# Patient Record
Sex: Female | Born: 1960 | Race: White | Hispanic: No | Marital: Married | Smoking: Never smoker
Health system: Southern US, Community
[De-identification: ages and names within clinical notes are randomized; demographics above are authoritative.]

## PROBLEM LIST (undated history)

## (undated) DIAGNOSIS — R011 Cardiac murmur, unspecified: Secondary | ICD-10-CM

## (undated) DIAGNOSIS — E663 Overweight: Secondary | ICD-10-CM

## (undated) DIAGNOSIS — R001 Bradycardia, unspecified: Secondary | ICD-10-CM

## (undated) DIAGNOSIS — Z6825 Body mass index (BMI) 25.0-25.9, adult: Secondary | ICD-10-CM

## (undated) HISTORY — PX: NO PAST SURGERIES: SHX2092

## (undated) HISTORY — DX: Cardiac murmur, unspecified: R01.1

## (undated) HISTORY — DX: Overweight: E66.3

## (undated) HISTORY — DX: Bradycardia, unspecified: R00.1

## (undated) HISTORY — DX: Body mass index (BMI) 25.0-25.9, adult: Z68.25

---

## 1998-05-26 ENCOUNTER — Inpatient Hospital Stay (HOSPITAL_COMMUNITY): Admission: AD | Admit: 1998-05-26 | Discharge: 1998-05-29 | Payer: Self-pay | Admitting: Obstetrics and Gynecology

## 2010-08-08 ENCOUNTER — Emergency Department (HOSPITAL_COMMUNITY)
Admission: EM | Admit: 2010-08-08 | Discharge: 2010-08-08 | Payer: Self-pay | Source: Home / Self Care | Admitting: Emergency Medicine

## 2015-08-24 ENCOUNTER — Encounter (HOSPITAL_BASED_OUTPATIENT_CLINIC_OR_DEPARTMENT_OTHER): Payer: Self-pay | Admitting: *Deleted

## 2015-08-24 ENCOUNTER — Emergency Department (HOSPITAL_BASED_OUTPATIENT_CLINIC_OR_DEPARTMENT_OTHER)
Admission: EM | Admit: 2015-08-24 | Discharge: 2015-08-24 | Disposition: A | Discharge status: Home / Self Care | Payer: 59 | Attending: Emergency Medicine | Admitting: Emergency Medicine

## 2015-08-24 DIAGNOSIS — S0181XA Laceration without foreign body of other part of head, initial encounter: Secondary | ICD-10-CM

## 2015-08-24 DIAGNOSIS — W228XXA Striking against or struck by other objects, initial encounter: Secondary | ICD-10-CM | POA: Diagnosis not present

## 2015-08-24 DIAGNOSIS — S01112A Laceration without foreign body of left eyelid and periocular area, initial encounter: Secondary | ICD-10-CM | POA: Insufficient documentation

## 2015-08-24 DIAGNOSIS — S0993XA Unspecified injury of face, initial encounter: Secondary | ICD-10-CM | POA: Diagnosis present

## 2015-08-24 DIAGNOSIS — Y9289 Other specified places as the place of occurrence of the external cause: Secondary | ICD-10-CM | POA: Diagnosis not present

## 2015-08-24 DIAGNOSIS — Y9302 Activity, running: Secondary | ICD-10-CM | POA: Diagnosis not present

## 2015-08-24 DIAGNOSIS — Z23 Encounter for immunization: Secondary | ICD-10-CM | POA: Insufficient documentation

## 2015-08-24 DIAGNOSIS — Y998 Other external cause status: Secondary | ICD-10-CM | POA: Diagnosis not present

## 2015-08-24 MED ORDER — ACETAMINOPHEN 325 MG PO TABS
975.0000 mg | ORAL_TABLET | Freq: Once | ORAL | Status: AC
  Administered 2015-08-24: 975 mg via ORAL
  Filled 2015-08-24: qty 3

## 2015-08-24 MED ORDER — TETANUS-DIPHTH-ACELL PERTUSSIS 5-2.5-18.5 LF-MCG/0.5 IM SUSP
0.5000 mL | Freq: Once | INTRAMUSCULAR | Status: AC
  Administered 2015-08-24: 0.5 mL via INTRAMUSCULAR
  Filled 2015-08-24: qty 0.5

## 2015-08-24 NOTE — Discharge Instructions (Signed)
Follow with your doctor in a week's time. Return for redness swelling drainage. He can apply bacitracin or Vaseline after 3-5 days to dissolve the adhesive.

## 2015-08-24 NOTE — ED Notes (Signed)
Lac to forehead- hit in head with car door- denies LOC

## 2015-08-24 NOTE — ED Notes (Signed)
Pt is requesting to see Dr. Adela LankFloyd prior to leaving.

## 2015-08-24 NOTE — ED Provider Notes (Signed)
CSN: [id number]     Arrival date & time 08/24/15  2031 History   First MD Initiated Contact with Patient 08/24/15 2051     Chief Complaint  Patient presents with  . Facial Laceration     (Consider location/radiation/quality/duration/timing/severity/associated sxs/prior Treatment) Patient is a 55 y.o. female presenting with skin laceration. The history is provided by the patient.  Laceration Location:  Face Facial laceration location:  L eyebrow Length (cm):  2 Depth:  Cutaneous Quality: straight   Bleeding: venous   Time since incident: 2. Laceration mechanism:  Blunt object (car door) Pain details:    Quality:  Burning   Severity:  Mild   Timing:  Constant   Progression:  Unchanged Foreign body present:  No foreign bodies Relieved by:  Nothing Worsened by:  Nothing tried Ineffective treatments:  None tried Tetanus status:  Out of date   History reviewed. No pertinent past medical history. History reviewed. No pertinent past surgical history. No family history on file. Social History  Substance Use Topics  . Smoking status: Never Smoker   . Smokeless tobacco: Never Used  . Alcohol Use: Yes     Comment: 3-4 drinks/ week   OB History    No data available     Review of Systems  Constitutional: Negative for fever and chills.  HENT: Negative for congestion and rhinorrhea.   Eyes: Negative for redness and visual disturbance.  Respiratory: Negative for shortness of breath and wheezing.   Cardiovascular: Negative for chest pain and palpitations.  Gastrointestinal: Negative for nausea and vomiting.  Genitourinary: Negative for dysuria and urgency.  Musculoskeletal: Negative for myalgias and arthralgias.  Skin: Positive for wound. Negative for pallor.  Neurological: Negative for dizziness and headaches.      Allergies  Review of patient's allergies indicates no known allergies.  Home Medications   Prior to Admission medications   Not on File   BP 120/74 mmHg   Pulse 60  Temp(Src) 98.2 F (36.8 C) (Oral)  Resp 16  Ht 5\' 6"  (1.676 m)  Wt 145 lb (65.772 kg)  BMI 23.41 kg/m2  SpO2 100%  LMP 08/15/2015 Physical Exam  Constitutional: She is oriented to person, place, and time. She appears well-developed and well-nourished. No distress.  HENT:  Head: Normocephalic and atraumatic.    Eyes: EOM are normal. Pupils are equal, round, and reactive to light.  Neck: Normal range of motion. Neck supple.  Cardiovascular: Normal rate and regular rhythm.  Exam reveals no gallop and no friction rub.   No murmur heard. Pulmonary/Chest: Effort normal. She has no wheezes. She has no rales.  Abdominal: Soft. She exhibits no distension. There is no tenderness. There is no rebound and no guarding.  Musculoskeletal: She exhibits no edema or tenderness.  Neurological: She is alert and oriented to person, place, and time.  Skin: Skin is warm and dry. She is not diaphoretic.  Psychiatric: She has a normal mood and affect. Her behavior is normal.  Nursing note and vitals reviewed.   ED Course  .Marland Kitchen.Laceration Repair Date/Time: 08/24/2015 11:26 PM Performed by: Adela LankFLOYD, Rebbeca Sheperd Authorized by: Melene PlanFLOYD, Dominga Mcduffie Consent: Verbal consent obtained. Risks and benefits: risks, benefits and alternatives were discussed Consent given by: patient Required items: required blood products, implants, devices, and special equipment available Time out: Immediately prior to procedure a "time out" was called to verify the correct patient, procedure, equipment, support staff and site/side marked as required. Body area: head/neck Location details: left eyebrow Laceration length: 2 cm Tendon  involvement: none Nerve involvement: none Vascular damage: no Anesthesia: local infiltration Local anesthetic: lidocaine 1% with epinephrine Anesthetic total: 2 ml Patient sedated: no Preparation: Patient was prepped and draped in the usual sterile fashion. Irrigation solution: saline Irrigation method:  jet lavage Amount of cleaning: standard Debridement: none Degree of undermining: none Skin closure: glue Approximation: close Approximation difficulty: simple Patient tolerance: Patient tolerated the procedure well with no immediate complications   (including critical care time) Labs Review Labs Reviewed - No data to display  Imaging Review No results found. I have personally reviewed and evaluated these images and lab results as part of my medical decision-making.   EKG Interpretation None      MDM   Final diagnoses:  Facial laceration, initial encounter    55 yo F with a chief complaint of a facial laceration. Patient was trying to go to the car and ran into the car door. Superficial laceration just through the skin. Repaired with glue. PCP follow-up.  11:30 PM:  I have discussed the diagnosis/risks/treatment options with the patient and family and believe the pt to be eligible for discharge home to follow-up with PCP. We also discussed returning to the ED immediately if new or worsening sx occur. We discussed the sx which are most concerning (e.g., sudden worsening redness, fever, drainage) that necessitate immediate return. Medications administered to the patient during their visit and any new prescriptions provided to the patient are listed below.  Medications given during this visit Medications  Tdap (BOOSTRIX) injection 0.5 mL (0.5 mLs Intramuscular Given 08/24/15 2142)  acetaminophen (TYLENOL) tablet 975 mg (975 mg Oral Given 08/24/15 2138)    There are no discharge medications for this patient.   The patient appears reasonably screen and/or stabilized for discharge and I doubt any other medical condition or other [facility] requiring further screening, evaluation, or treatment in the ED at this time prior to discharge.      Melene Plan, DO 08/24/15 2331

## 2018-05-01 ENCOUNTER — Emergency Department (HOSPITAL_COMMUNITY): Payer: 59

## 2018-05-01 ENCOUNTER — Emergency Department (HOSPITAL_COMMUNITY)
Admission: EM | Admit: 2018-05-01 | Discharge: 2018-05-01 | Disposition: A | Discharge status: Home / Self Care | Payer: 59 | Attending: Emergency Medicine | Admitting: Emergency Medicine

## 2018-05-01 ENCOUNTER — Other Ambulatory Visit: Payer: Self-pay

## 2018-05-01 ENCOUNTER — Encounter (HOSPITAL_COMMUNITY): Payer: Self-pay | Admitting: Emergency Medicine

## 2018-05-01 DIAGNOSIS — Y939 Activity, unspecified: Secondary | ICD-10-CM | POA: Insufficient documentation

## 2018-05-01 DIAGNOSIS — X501XXA Overexertion from prolonged static or awkward postures, initial encounter: Secondary | ICD-10-CM | POA: Diagnosis not present

## 2018-05-01 DIAGNOSIS — Y999 Unspecified external cause status: Secondary | ICD-10-CM | POA: Insufficient documentation

## 2018-05-01 DIAGNOSIS — Y929 Unspecified place or not applicable: Secondary | ICD-10-CM | POA: Insufficient documentation

## 2018-05-01 DIAGNOSIS — S93401A Sprain of unspecified ligament of right ankle, initial encounter: Secondary | ICD-10-CM

## 2018-05-01 IMAGING — DX DG ANKLE COMPLETE 3+V*R*
3 series · 3 of 3 positions shown · non-contrast
Comparison: None.

CLINICAL DATA: Fell on steps and twisted the right ankle. Pain and
swelling.

EXAM:
RIGHT ANKLE - COMPLETE 3+ VIEW

[ankle ap]
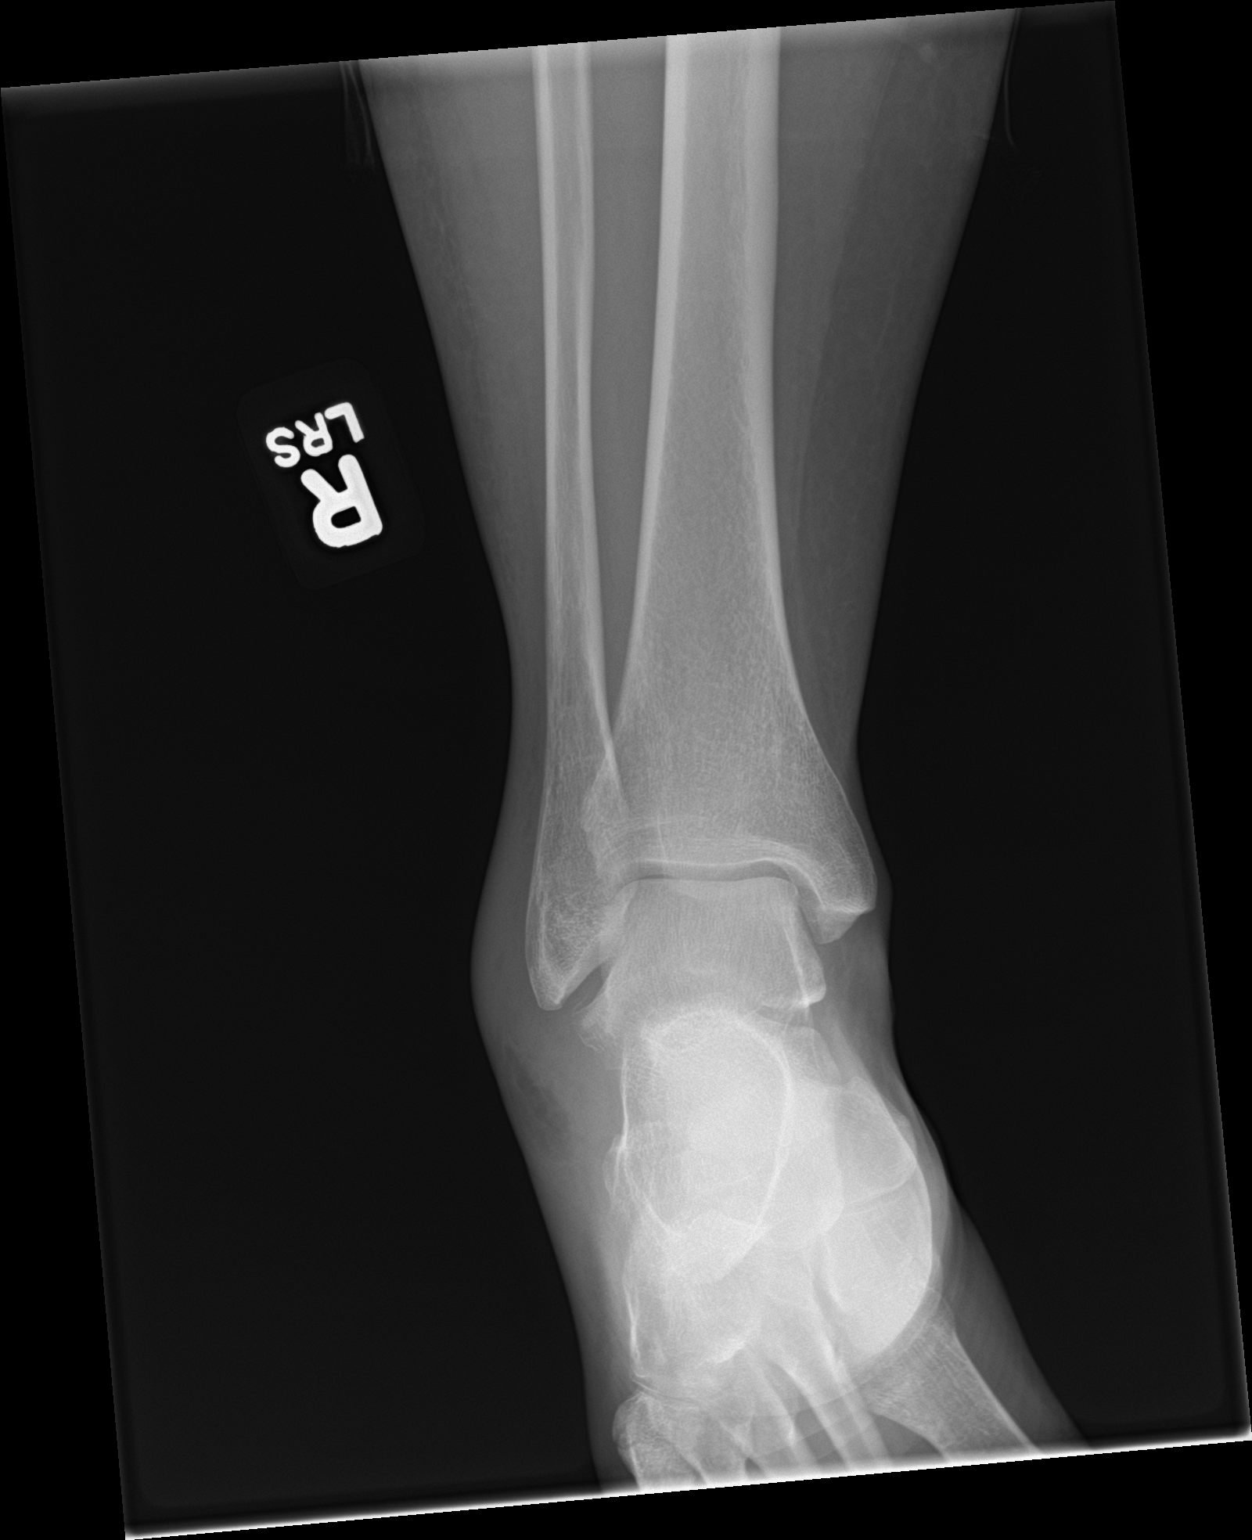

[ankle obl]
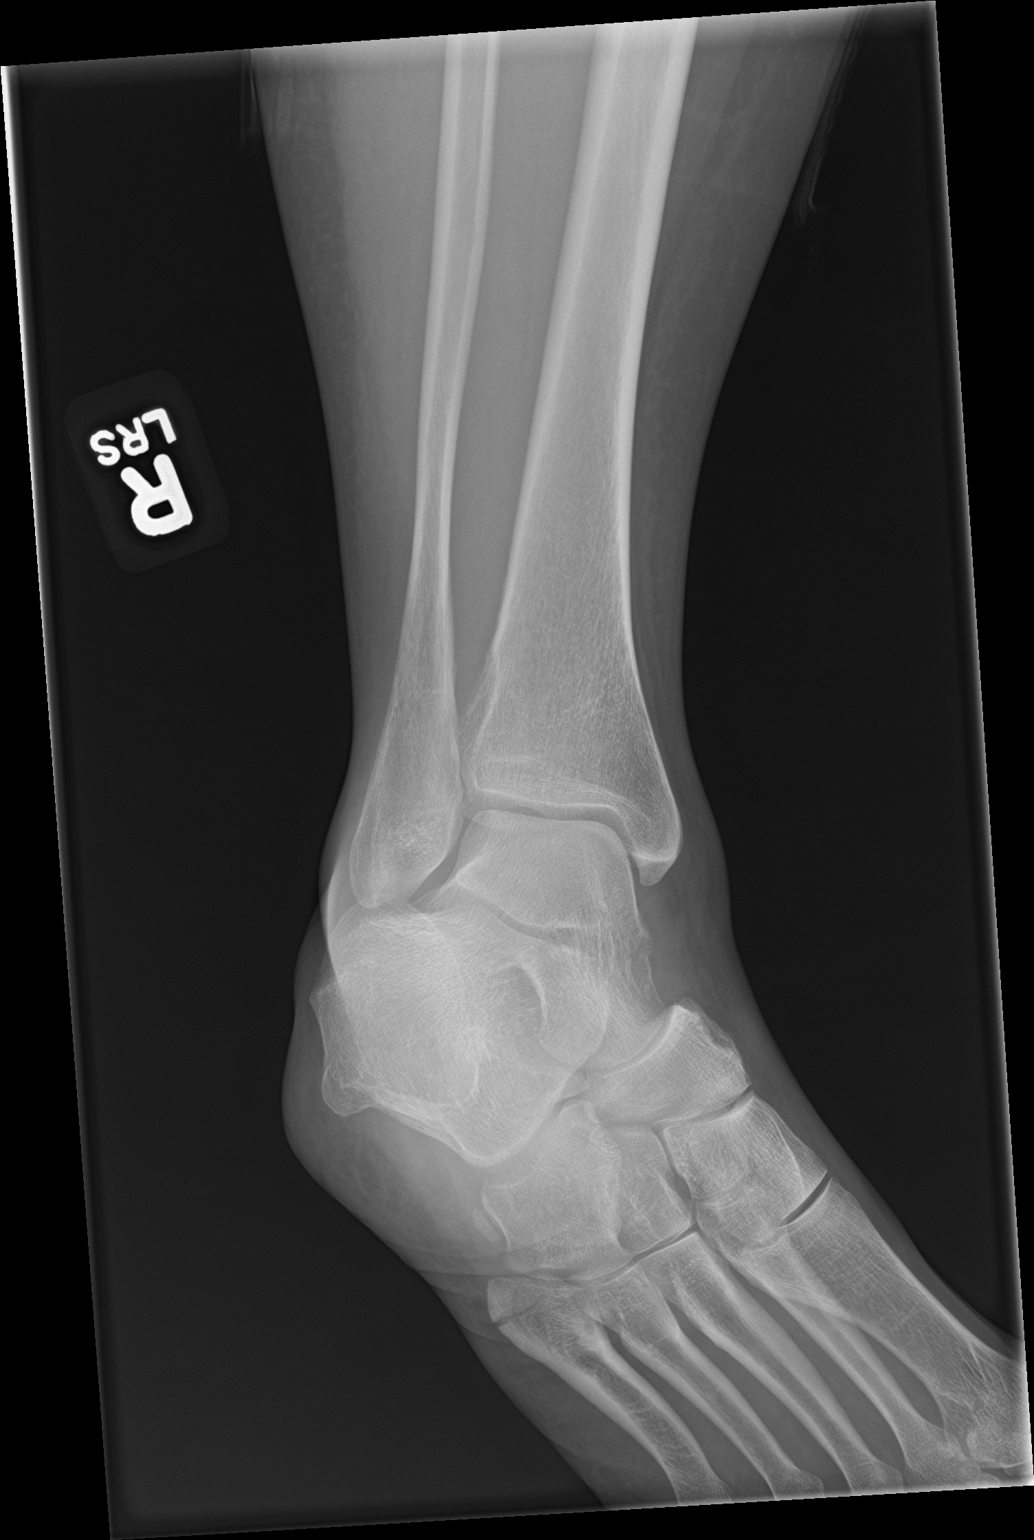

[ankle lat]
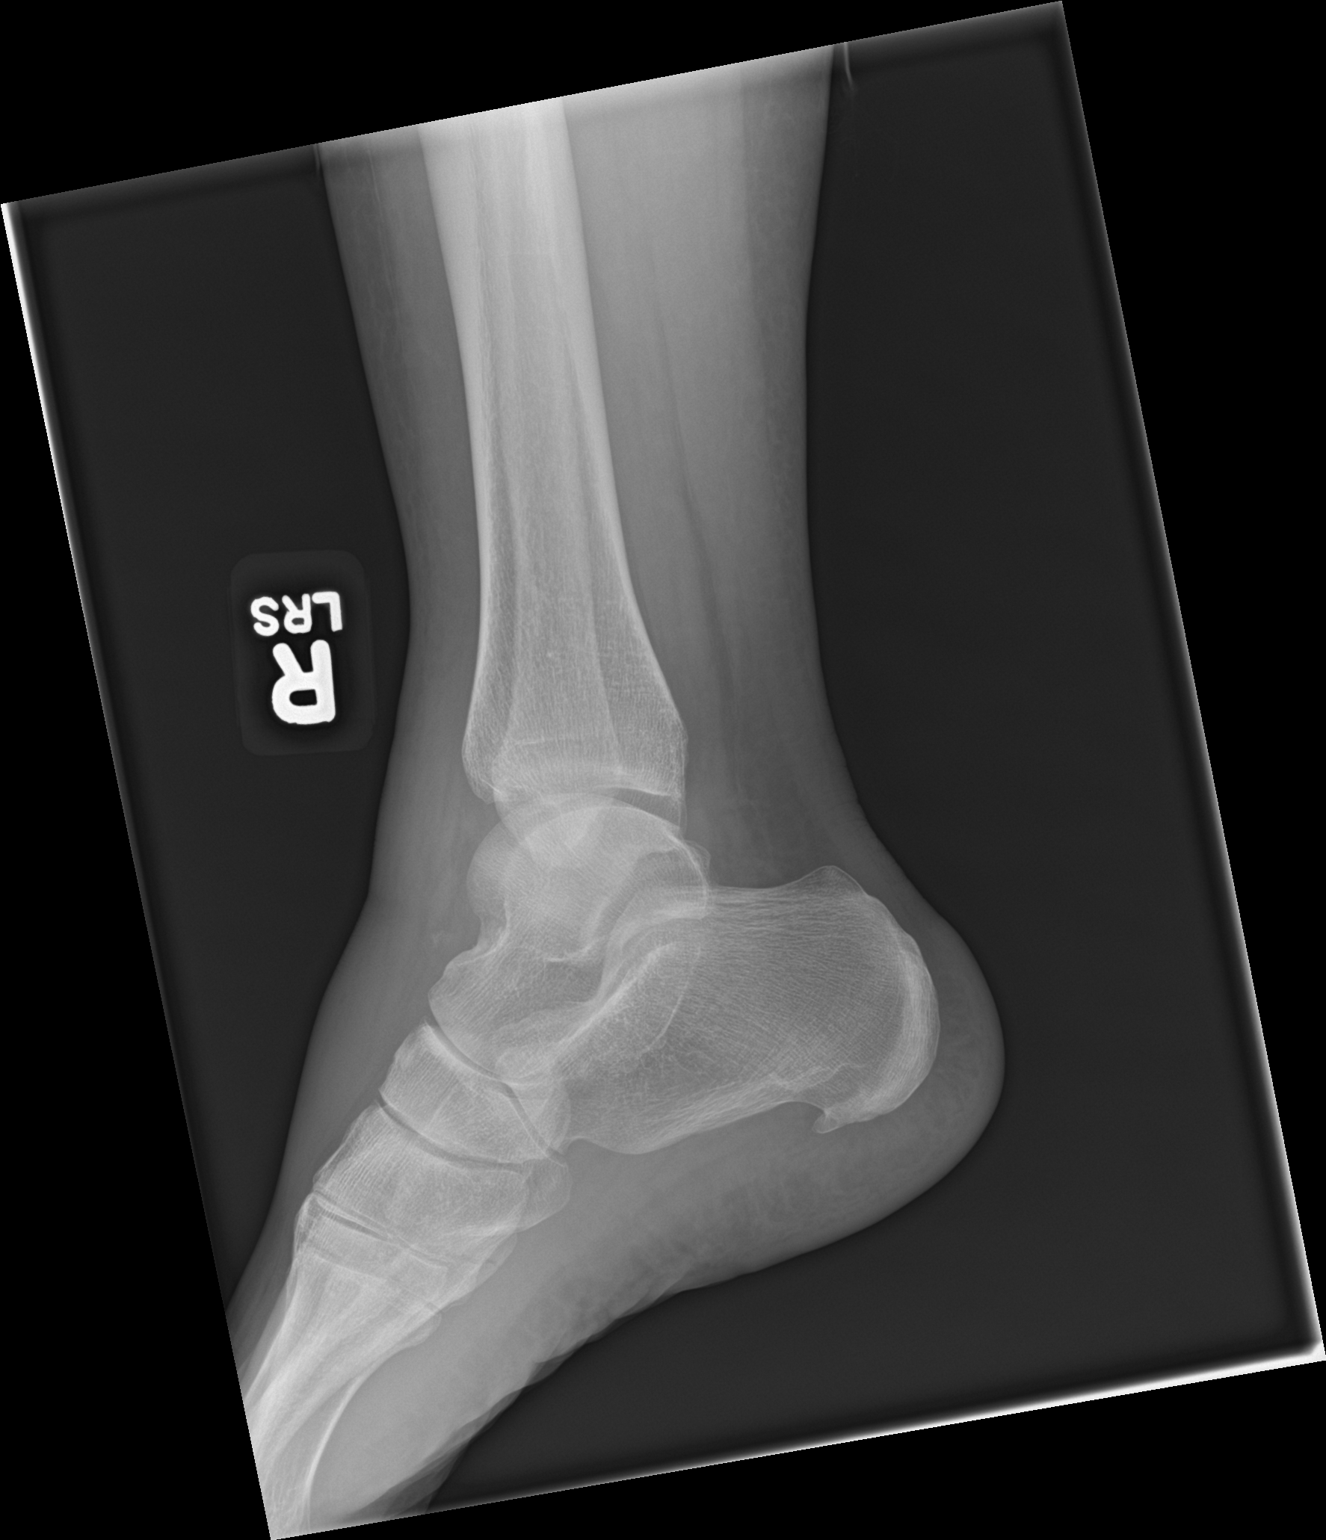

[3 of 3 positions shown; findings below may reference images not displayed]

FINDINGS: Diffuse soft tissue swelling over the anterior and lateral aspect of
the right ankle. Acute appearing ununited ossicles demonstrated
anterior to the mid talus and in the lateral talofibular joint
suggesting ligamentous avulsion injuries. No displaced fractures are
identified. Talar dome appears intact. No dislocation. Small plantar
calcaneal spur.
IMPRESSION: Soft tissue swelling over the anterior and lateral aspect of the
right ankle. Ununited ossicles demonstrated anterior to the talus
and in the lateral talofibular joint suggesting ligamentous avulsion
injuries.

## 2018-05-01 MED ORDER — IBUPROFEN 600 MG PO TABS: 600.0000 mg | ORAL_TABLET | Freq: Four times a day (QID) | ORAL | 0 refills | Status: DC | PRN

## 2018-05-01 MED ORDER — IBUPROFEN 400 MG PO TABS
600.0000 mg | ORAL_TABLET | Freq: Once | ORAL | Status: AC
  Administered 2018-05-01: 600 mg via ORAL
  Filled 2018-05-01: qty 1

## 2018-05-01 MED ORDER — OXYCODONE-ACETAMINOPHEN 5-325 MG PO TABS
1.0000 | ORAL_TABLET | ORAL | Status: AC | PRN
  Administered 2018-05-01 (×2): 1 via ORAL
  Filled 2018-05-01 (×2): qty 1

## 2018-05-01 NOTE — ED Notes (Addendum)
Pt reports missing the last step as she was walking down steps. Pt denies any pain at this time. Pt reports the pain medicine she was given in triage helped her.

## 2018-05-01 NOTE — ED Triage Notes (Signed)
Pt was going outside to shake a rug and missed the last step.  C/o pain and swelling to R ankle.

## 2018-05-01 NOTE — ED Provider Notes (Signed)
[facility]   CSN: [id number] Arrival date & time: 05/01/18  0018     History   Chief Complaint Chief Complaint  Patient presents with  . Ankle Pain    HPI Janet Mclaughlin is a 57 y.o. female.  Patient here with pain and swelling to her right ankle after missing a step causing an inversion motion of the ankle. She states she came down with her full weight and has been unable to put any pressure on the ankle since. Injury occurred around 11:00 pm 04/30/18. No wound.      History reviewed. No pertinent past medical history.  There are no active problems to display for this patient.   History reviewed. No pertinent surgical history.   OB History   None      Home Medications    Prior to Admission medications   Not on File    Family History No family history on file.  Social History Social History   Tobacco Use  . Smoking status: Never Smoker  . Smokeless tobacco: Never Used  Substance Use Topics  . Alcohol use: Yes    Comment: 3-4 drinks/ week  . Drug use: No     Allergies   Patient has no known allergies.   Review of Systems Review of Systems  Constitutional: Negative for diaphoresis.  Musculoskeletal:       See HPI.  Skin: Negative.   Neurological: Negative.      Physical Exam Updated Vital Signs BP 128/84   Pulse 61   Temp 98.5 F (36.9 C) (Oral)   Resp 18   SpO2 100%   Physical Exam  Constitutional: She is oriented to person, place, and time. She appears well-developed and well-nourished.  Neck: Normal range of motion.  Cardiovascular: Intact distal pulses.  Pulmonary/Chest: Effort normal.  Musculoskeletal:  Right ankle swollen with light ecchymosis to anterolateral ankle. No bony deformity. No joint laxity. Achilles intact. No calf tenderness or swelling.  Neurological: She is alert and oriented to person, place, and time. No sensory deficit.  Skin: Skin is warm and dry.      ED Treatments / Results  Labs (all labs ordered are listed, but only abnormal results are displayed) Labs Reviewed - No data to display  EKG None  Radiology Dg Ankle Complete Right  Result Date: 05/01/2018 CLINICAL DATA:  Larey SeatFell on steps and twisted the right ankle. Pain and swelling. EXAM: RIGHT ANKLE - COMPLETE 3+ VIEW COMPARISON:  None. FINDINGS: Diffuse soft tissue swelling over the anterior and lateral aspect of the right ankle. Acute appearing ununited ossicles demonstrated anterior to the mid talus and in the lateral talofibular joint suggesting ligamentous avulsion injuries. No displaced fractures are identified. Talar dome appears intact. No dislocation. Small plantar calcaneal spur. IMPRESSION: Soft tissue swelling over the anterior and lateral aspect of the right ankle. Ununited ossicles demonstrated anterior to the talus and in the lateral talofibular joint suggesting ligamentous avulsion injuries. Electronically Signed   By: Burman NievesWilliam  Stevens M.D.   On: 05/01/2018 01:02    Procedures Procedures (including critical care time)  Medications Ordered in ED Medications  oxyCODONE-acetaminophen (PERCOCET/ROXICET) 5-325 MG per tablet 1 tablet (1 tablet Oral Given 05/01/18 0035)     Initial Impression / Assessment and Plan / ED Course  I have reviewed the triage vital signs and the nursing notes.  Pertinent labs & imaging results that were available during my care of the patient were reviewed by me  and considered in my medical decision making (see chart for details).     Patient here with right ankle inversion injury. No fracture. ASO and crutches provided. Refer to orthopedics for any persistent pain. Weight bearing as tolerated.  Final Clinical Impressions(s) / ED Diagnoses   Final diagnoses:  None   1. Right ankle sprain  ED Discharge Orders    None       Danne Harbor 05/02/18 2242    Glynn Octave, MD 05/03/18 [phone]

## 2018-05-01 NOTE — ED Notes (Signed)
Patient verbalizes understanding of discharge instructions. Opportunity for questioning and answers were provided. Armband removed by staff, pt discharged from ED home via POV.  

## 2019-06-11 ENCOUNTER — Other Ambulatory Visit: Payer: Self-pay

## 2019-06-11 DIAGNOSIS — Z20822 Contact with and (suspected) exposure to covid-19: Secondary | ICD-10-CM

## 2019-06-12 ENCOUNTER — Telehealth: Payer: Self-pay

## 2019-06-12 LAB — NOVEL CORONAVIRUS, NAA: SARS-CoV-2, NAA: NOT DETECTED

## 2019-06-12 NOTE — Telephone Encounter (Signed)
Received call from patient checking Covid results.  Advised no resutls at this time.   

## 2019-09-17 ENCOUNTER — Other Ambulatory Visit: Payer: Self-pay | Admitting: Obstetrics and Gynecology

## 2019-09-17 DIAGNOSIS — N6320 Unspecified lump in the left breast, unspecified quadrant: Secondary | ICD-10-CM

## 2019-09-17 DIAGNOSIS — N6310 Unspecified lump in the right breast, unspecified quadrant: Secondary | ICD-10-CM

## 2019-10-04 ENCOUNTER — Other Ambulatory Visit: Payer: Self-pay | Admitting: Obstetrics and Gynecology

## 2019-10-04 ENCOUNTER — Ambulatory Visit
Admission: RE | Admit: 2019-10-04 | Discharge: 2019-10-04 | Disposition: A | Discharge status: Home / Self Care | Payer: 59 | Source: Ambulatory Visit | Attending: Obstetrics and Gynecology | Admitting: Obstetrics and Gynecology

## 2019-10-04 ENCOUNTER — Other Ambulatory Visit: Payer: 59

## 2019-10-04 ENCOUNTER — Other Ambulatory Visit: Payer: Self-pay

## 2019-10-04 DIAGNOSIS — N6310 Unspecified lump in the right breast, unspecified quadrant: Secondary | ICD-10-CM

## 2019-10-04 DIAGNOSIS — R921 Mammographic calcification found on diagnostic imaging of breast: Secondary | ICD-10-CM

## 2019-10-04 DIAGNOSIS — N6320 Unspecified lump in the left breast, unspecified quadrant: Secondary | ICD-10-CM

## 2019-10-04 IMAGING — MG DIGITAL DIAGNOSTIC BILAT W/ TOMO W/ CAD
8 of 18 series · 8 of 40 positions shown · non-contrast
Comparison: None.

CLINICAL DATA: 58-year-old female presenting for baseline mammogram
a for bilateral palpable lumps. She also states she has palpable
lumps in her bilateral axilla.

EXAM:
DIGITAL DIAGNOSTIC BILATERAL MAMMOGRAM WITH CAD AND TOMO
ULTRASOUND BILATERAL BREAST

[R CC]
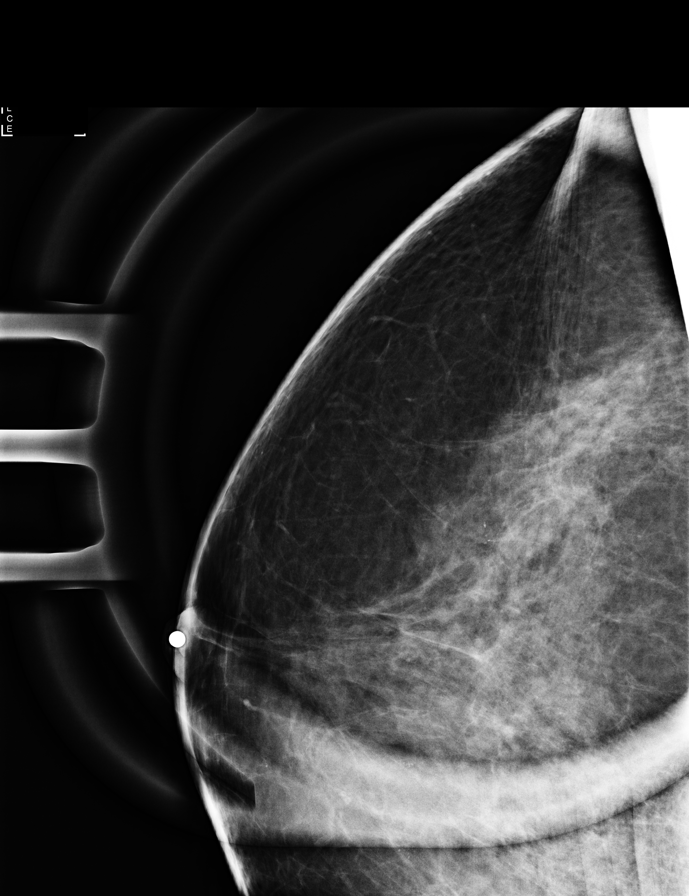

[L ML]
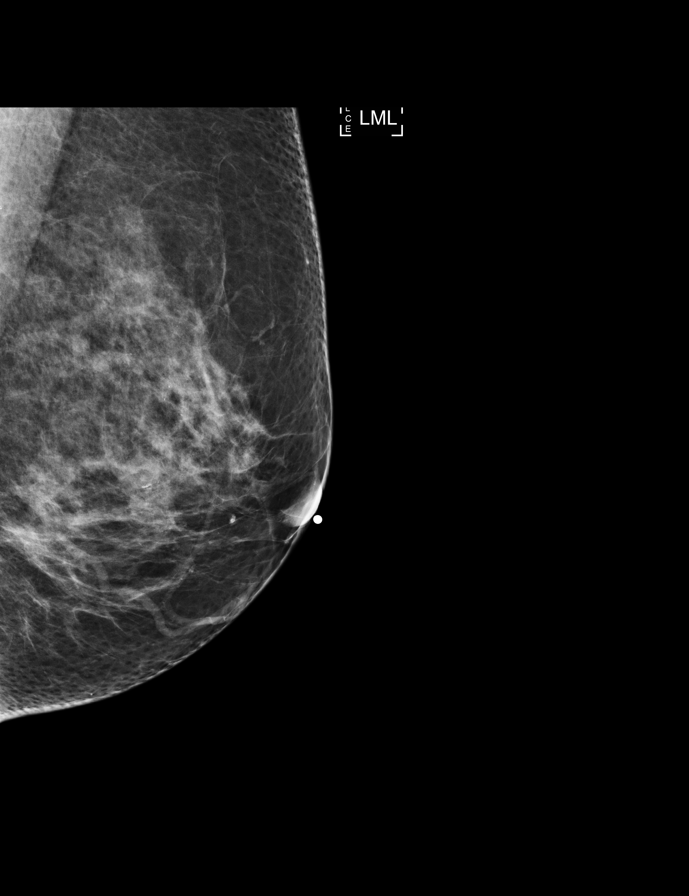

[L CC]
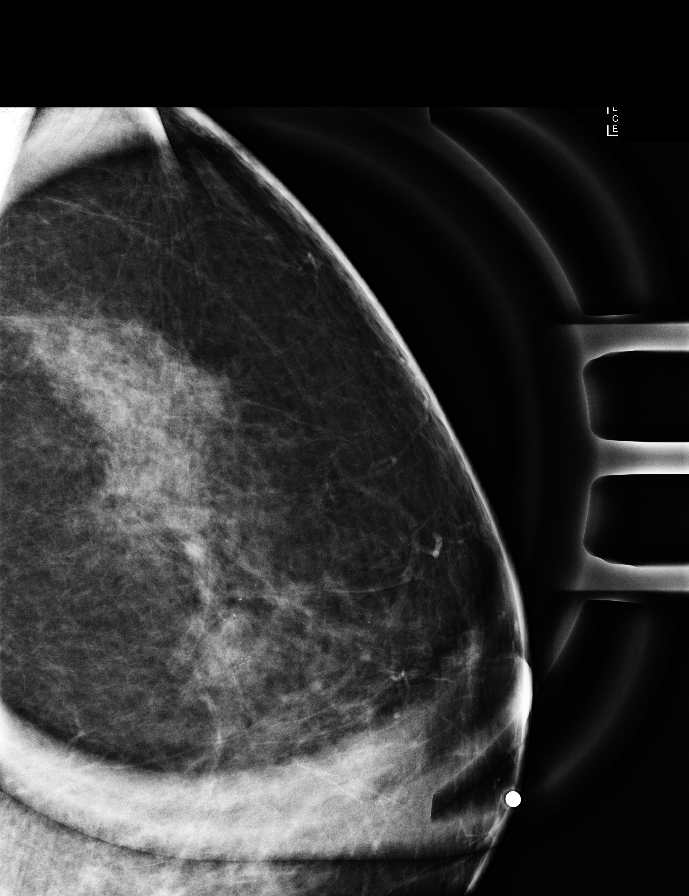

[R ML (1 of 2)]
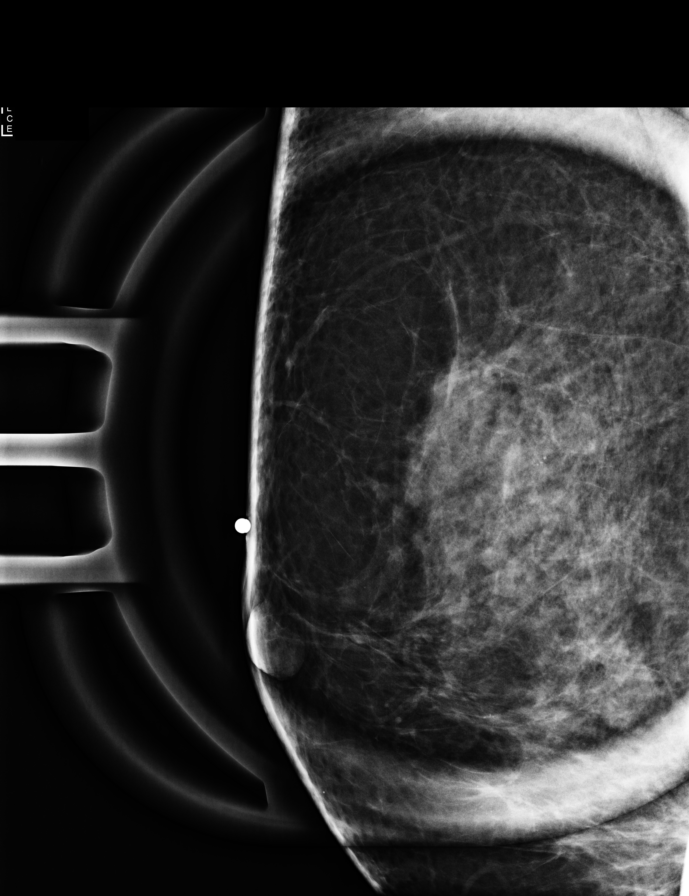

[R ML (2 of 2)]
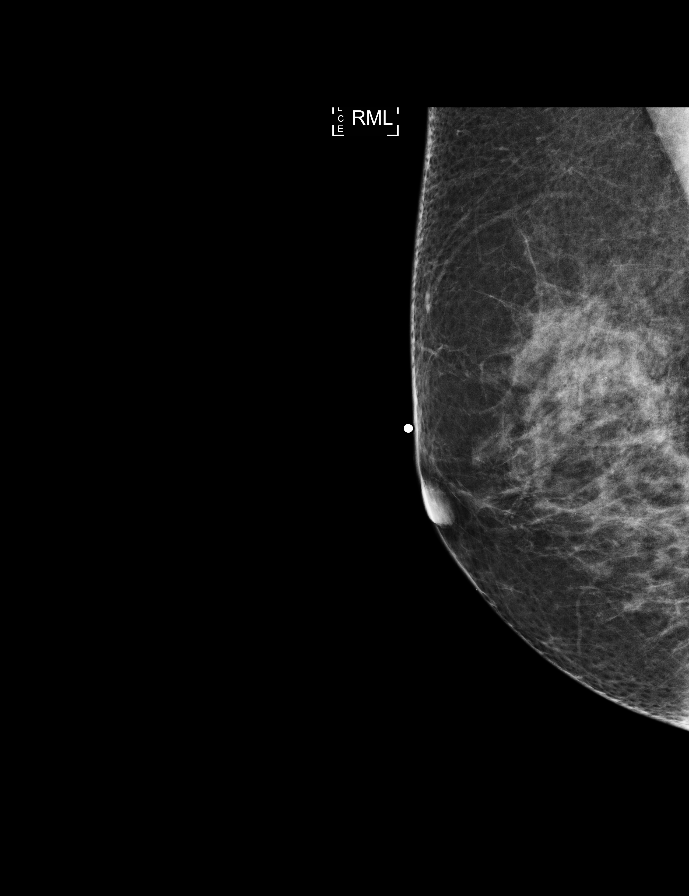

[L CC synth-2D]
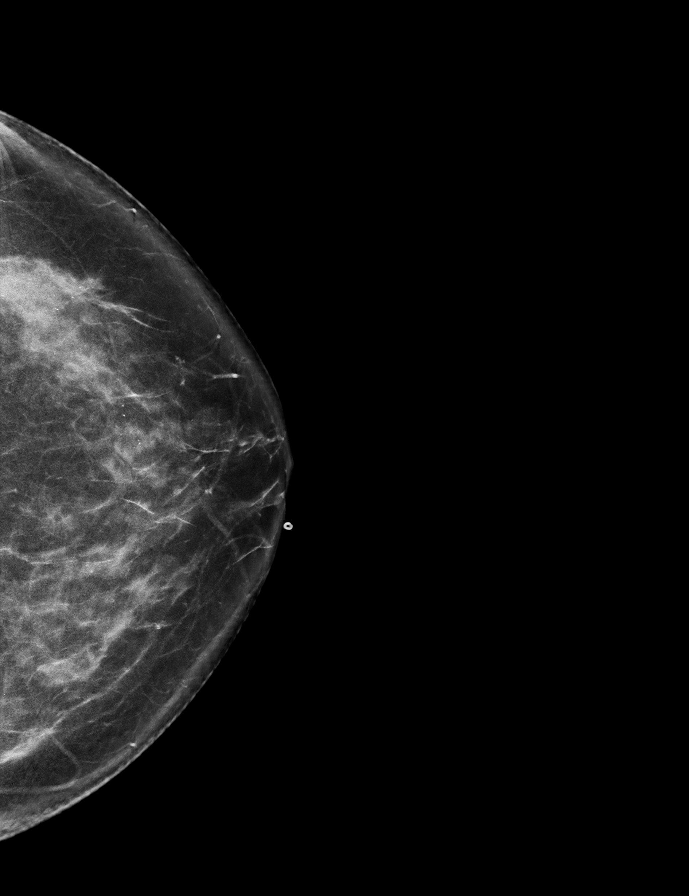

[L TAN synth-2D]
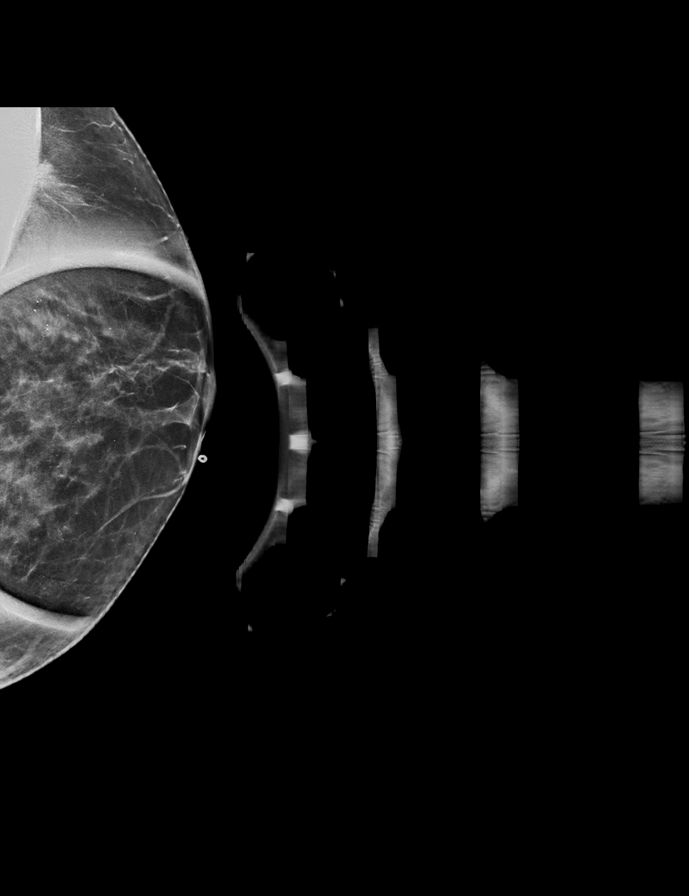

[L MLO synth-2D]
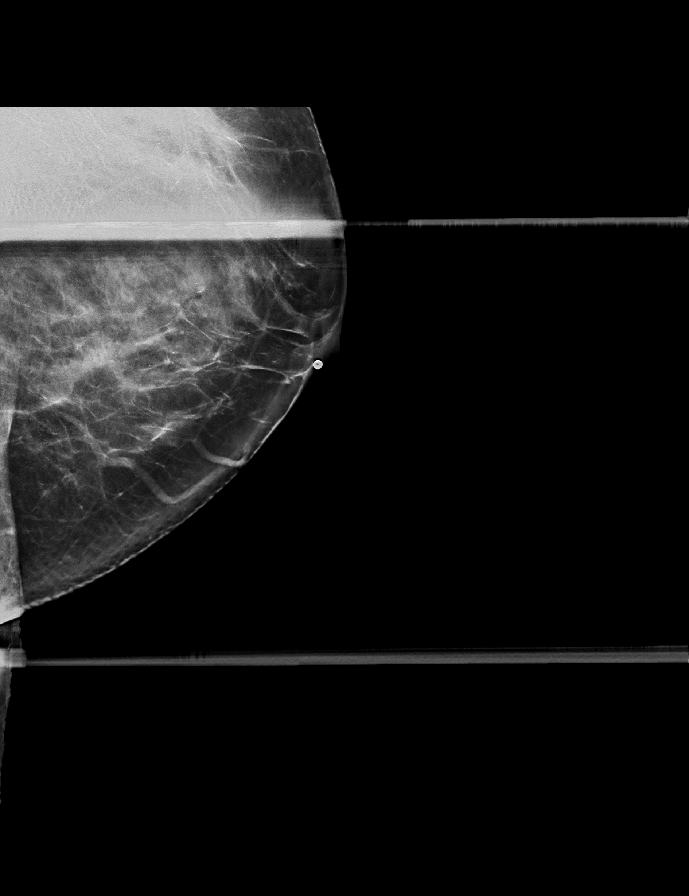

[8 of 40 positions shown; findings below may reference images not displayed]

ACR Breast Density Category c: The breast tissue is heterogeneously
dense, which may obscure small masses.
FINDINGS: There are BBs on the breasts bilaterally adjacent to the nipple
indicating the palpable sites of concern.

In the upper-outer quadrant of the left breast, there is a broad
area of loosely grouped calcifications which layer on the true
lateral view consistent with benign milk of calcium. There are
similar appearing calcifications in the upper-outer quadrant of the
right breast spanning at least 4 mm, however I suspect there are
some amorphous calcifications more posterior that are difficult to
see clearly. These do not definitely layer on the lateral view,
though milk of calcium is suspected.

There is an asymmetry in the lower outer quadrant of the left
breast, posterior depth. No underlying masses or areas of distortion
are identified on the spot compression tomosynthesis images.

There is a circumscribed oval mass in the lower outer quadrant of
the left breast, middle depth which measures approximately 1 cm.
There appears to be layering calcifications with in the mass
suggestive of a benign cyst.

Mammographic images were processed with CAD.

On physical exam, a firm ridge of tissue is palpated along the
medial aspect of the left areola, without a discrete palpable mass.
No suspicious palpable findings are identified in the superior
retroareolar right breast.

Ultrasound of the retroareolar right breast demonstrates normal
fibroglandular tissue. Ultrasound of the right axilla demonstrates
normal-appearing lymph nodes. No suspicious masses or areas of
shadowing are identified.

Ultrasound of the periareolar left breast at the palpable site
demonstrates normal fibroglandular tissue. In the left breast at 3
o'clock, 1 cm from the nipple there is an oval circumscribed
anechoic mass measuring 9 x 4 x 9 mm, consistent with a benign cyst.
The remainder of the lower outer quadrant of the left breast was
scanned demonstrating normal dense fibroglandular tissue. No
suspicious masses are identified. Ultrasound of the left axilla also
demonstrates normal lymph nodes without suspicious mass or area of
shadowing.
IMPRESSION: 1. There are no suspicious mammographic or targeted sonographic
abnormalities at the bilateral palpable sites near the nipple and in
the bilateral axillae.

2. Likely benign calcifications in the upper-outer quadrant of the
right breast are favored to represent fibrocystic change. Benign
milk of calcium (within fibrocystic change) is noted in the
upper-outer left breast.

3.  There is a benign cyst in the left breast at 3 o'clock.

4.  No mammographic evidence of malignancy in the bilateral breasts.

RECOMMENDATION:
1. Six-month follow-up diagnostic right breast mammogram is
recommended for the likely benign right breast calcifications.

2. Clinical follow-up recommended for the bilateral palpable lumps.
Any further workup should be based on clinical grounds.

I have discussed the findings and recommendations with the patient.
If applicable, a reminder letter will be sent to the patient
regarding the next appointment.

BI-RADS CATEGORY  3: Probably benign.

## 2019-10-04 IMAGING — US US BREAST*R* LIMITED INC AXILLA
1 series · 4 of 4 positions shown · non-contrast
Comparison: None.

CLINICAL DATA: 58-year-old female presenting for baseline mammogram
a for bilateral palpable lumps. She also states she has palpable
lumps in her bilateral axilla.

EXAM:
DIGITAL DIAGNOSTIC BILATERAL MAMMOGRAM WITH CAD AND TOMO
ULTRASOUND BILATERAL BREAST

[Series 1: us breast*right* limited inc axilla · 0.07mm/px · 4 of 4 slices shown]
[im 1/4]
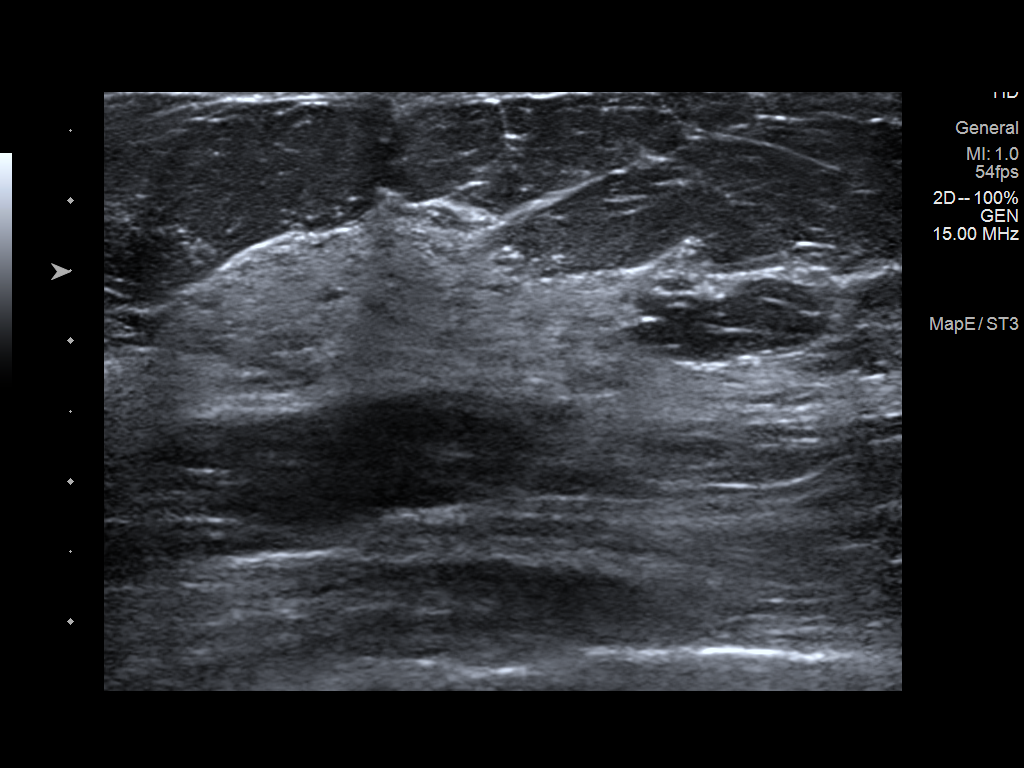
[im 2/4]
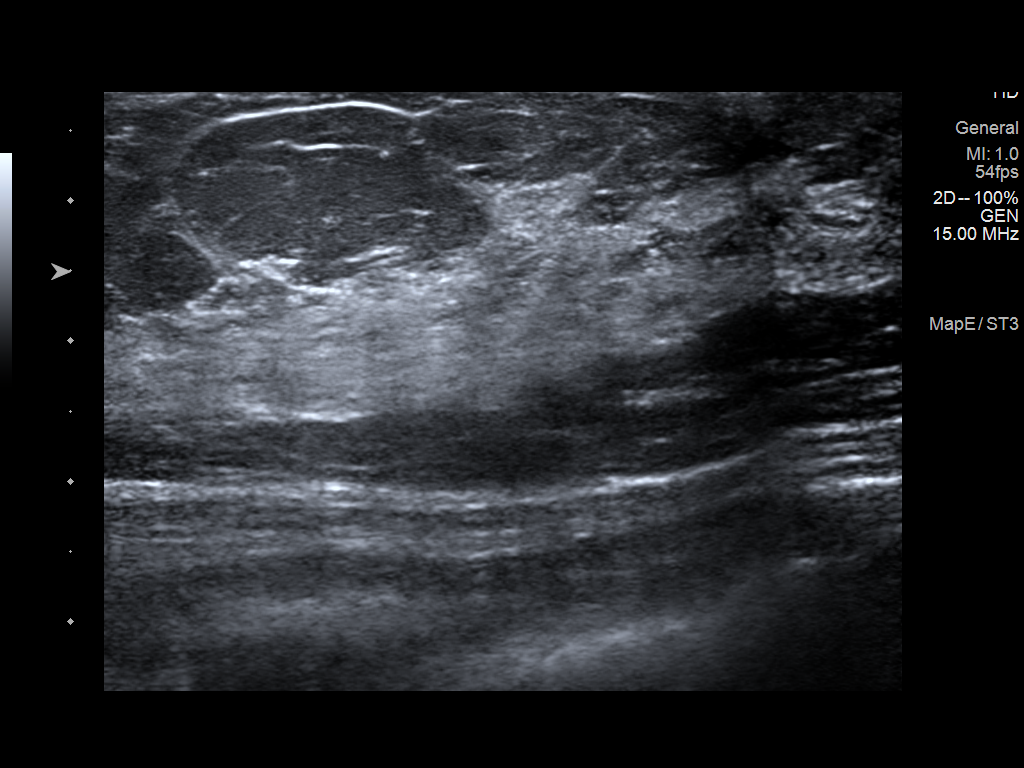
[im 3/4]
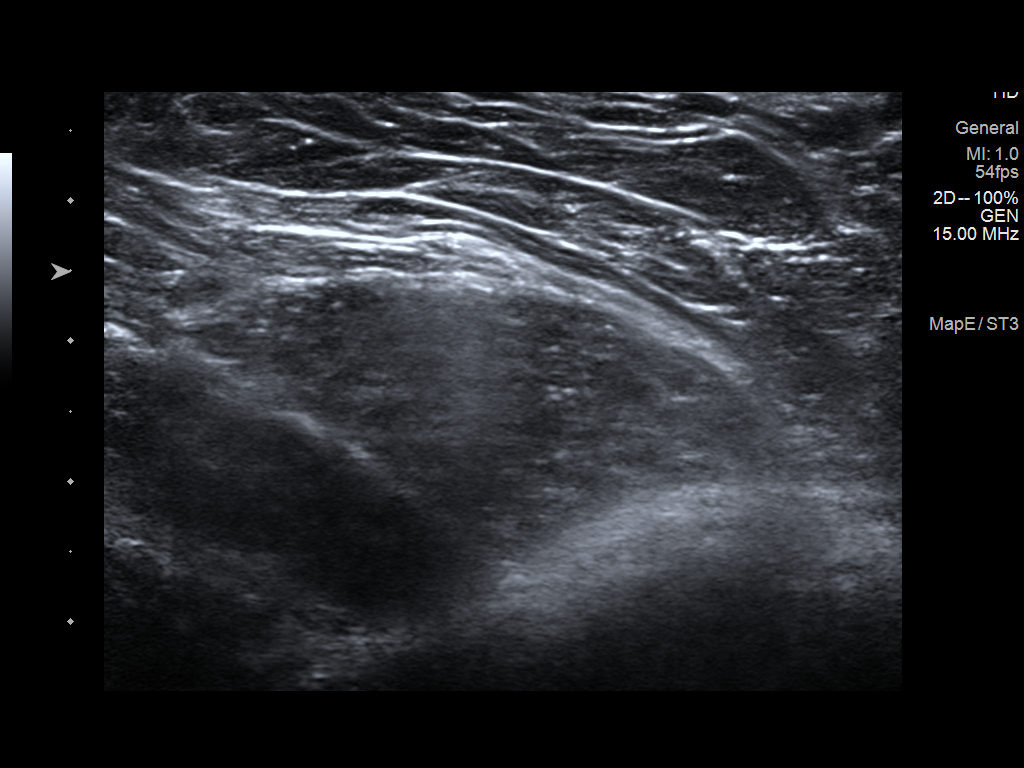
[im 4/4]
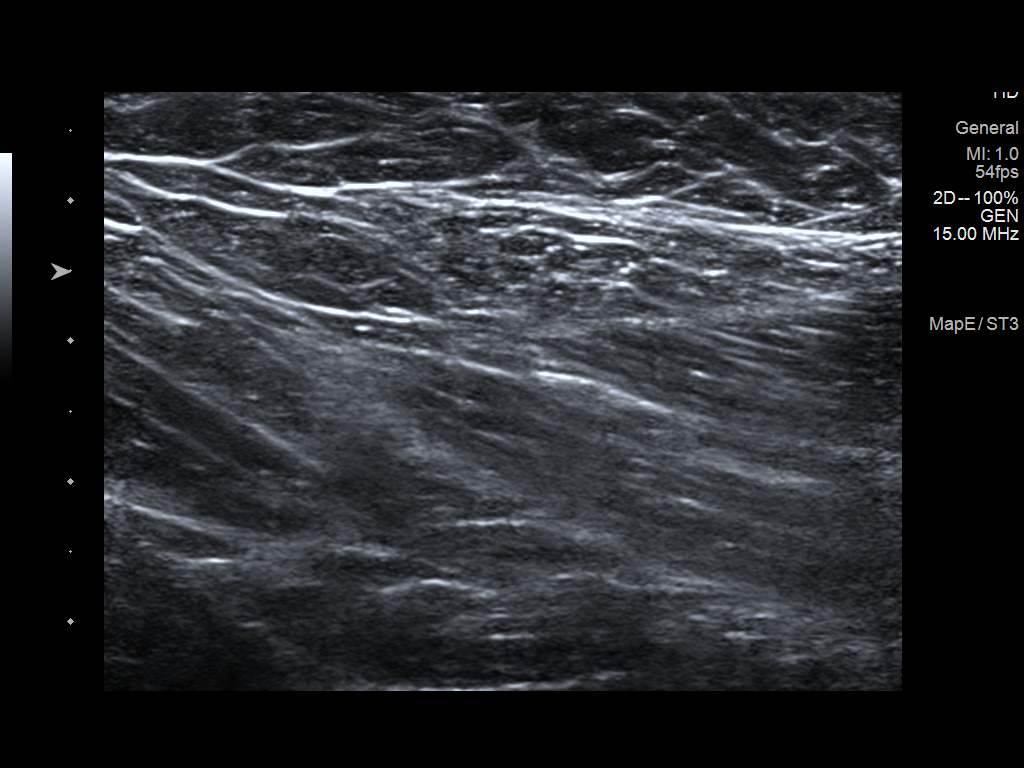

[4 of 4 positions shown; findings below may reference images not displayed]

ACR Breast Density Category c: The breast tissue is heterogeneously
dense, which may obscure small masses.
FINDINGS: There are BBs on the breasts bilaterally adjacent to the nipple
indicating the palpable sites of concern.

In the upper-outer quadrant of the left breast, there is a broad
area of loosely grouped calcifications which layer on the true
lateral view consistent with benign milk of calcium. There are
similar appearing calcifications in the upper-outer quadrant of the
right breast spanning at least 4 mm, however I suspect there are
some amorphous calcifications more posterior that are difficult to
see clearly. These do not definitely layer on the lateral view,
though milk of calcium is suspected.

There is an asymmetry in the lower outer quadrant of the left
breast, posterior depth. No underlying masses or areas of distortion
are identified on the spot compression tomosynthesis images.

There is a circumscribed oval mass in the lower outer quadrant of
the left breast, middle depth which measures approximately 1 cm.
There appears to be layering calcifications with in the mass
suggestive of a benign cyst.

Mammographic images were processed with CAD.

On physical exam, a firm ridge of tissue is palpated along the
medial aspect of the left areola, without a discrete palpable mass.
No suspicious palpable findings are identified in the superior
retroareolar right breast.

Ultrasound of the retroareolar right breast demonstrates normal
fibroglandular tissue. Ultrasound of the right axilla demonstrates
normal-appearing lymph nodes. No suspicious masses or areas of
shadowing are identified.

Ultrasound of the periareolar left breast at the palpable site
demonstrates normal fibroglandular tissue. In the left breast at 3
o'clock, 1 cm from the nipple there is an oval circumscribed
anechoic mass measuring 9 x 4 x 9 mm, consistent with a benign cyst.
The remainder of the lower outer quadrant of the left breast was
scanned demonstrating normal dense fibroglandular tissue. No
suspicious masses are identified. Ultrasound of the left axilla also
demonstrates normal lymph nodes without suspicious mass or area of
shadowing.
IMPRESSION: 1. There are no suspicious mammographic or targeted sonographic
abnormalities at the bilateral palpable sites near the nipple and in
the bilateral axillae.

2. Likely benign calcifications in the upper-outer quadrant of the
right breast are favored to represent fibrocystic change. Benign
milk of calcium (within fibrocystic change) is noted in the
upper-outer left breast.

3.  There is a benign cyst in the left breast at 3 o'clock.

4.  No mammographic evidence of malignancy in the bilateral breasts.

RECOMMENDATION:
1. Six-month follow-up diagnostic right breast mammogram is
recommended for the likely benign right breast calcifications.

2. Clinical follow-up recommended for the bilateral palpable lumps.
Any further workup should be based on clinical grounds.

I have discussed the findings and recommendations with the patient.
If applicable, a reminder letter will be sent to the patient
regarding the next appointment.

BI-RADS CATEGORY  3: Probably benign.

## 2019-10-04 IMAGING — US US BREAST*L* LIMITED INC AXILLA
1 series · 12 of 12 positions shown · non-contrast
Comparison: None.

CLINICAL DATA: 58-year-old female presenting for baseline mammogram
a for bilateral palpable lumps. She also states she has palpable
lumps in her bilateral axilla.

EXAM:
DIGITAL DIAGNOSTIC BILATERAL MAMMOGRAM WITH CAD AND TOMO
ULTRASOUND BILATERAL BREAST

[Series 1: us breast*left* limited inc axilla · 0.07mm/px · 12 of 12 slices shown]
[im 1/12]
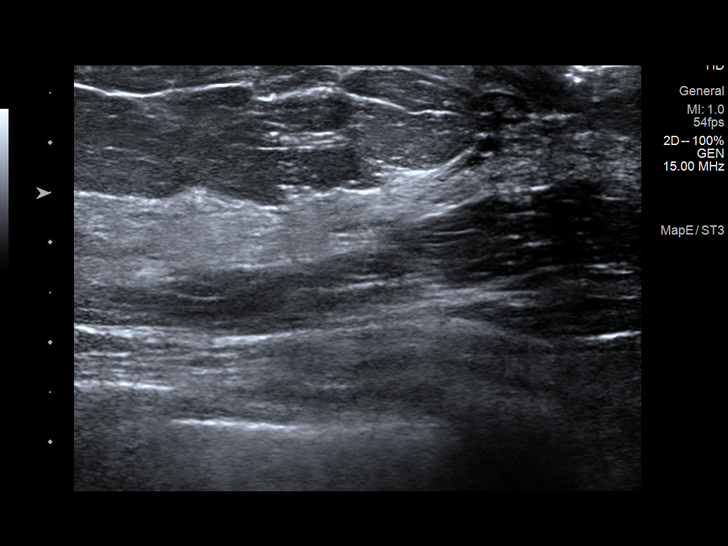
[im 2/12]
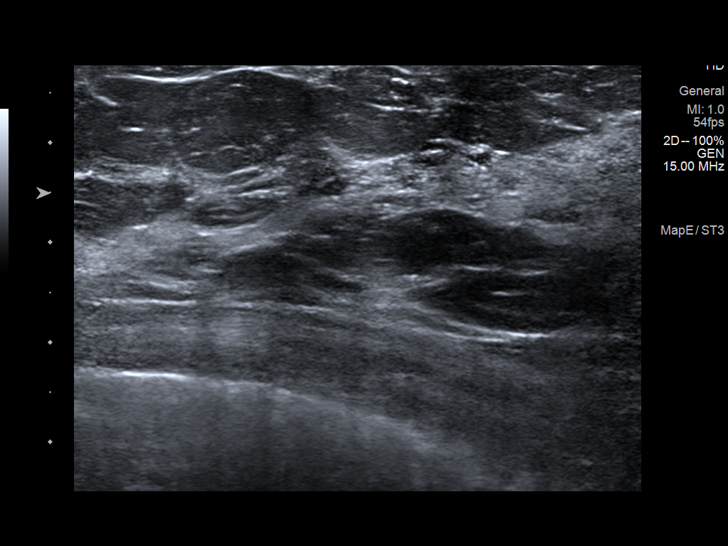
[im 3/12]
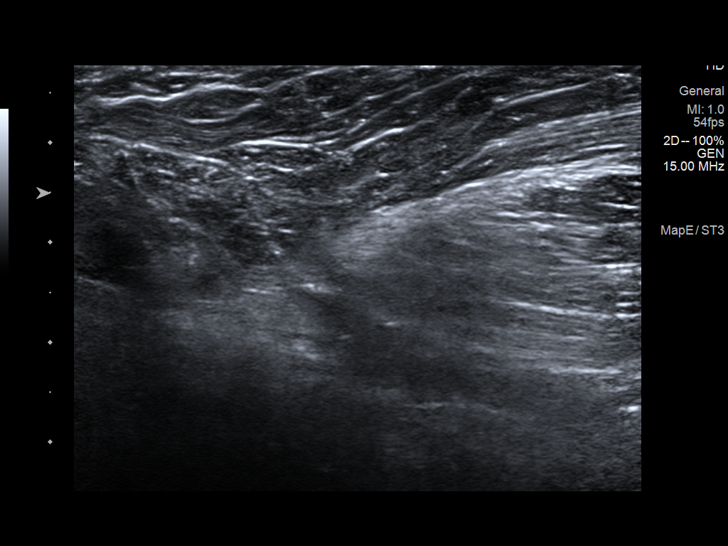
[im 4/12]
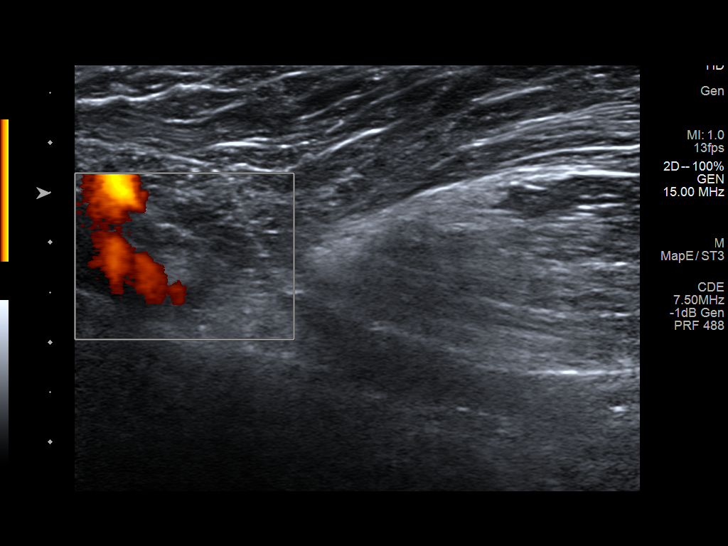
[im 5/12]
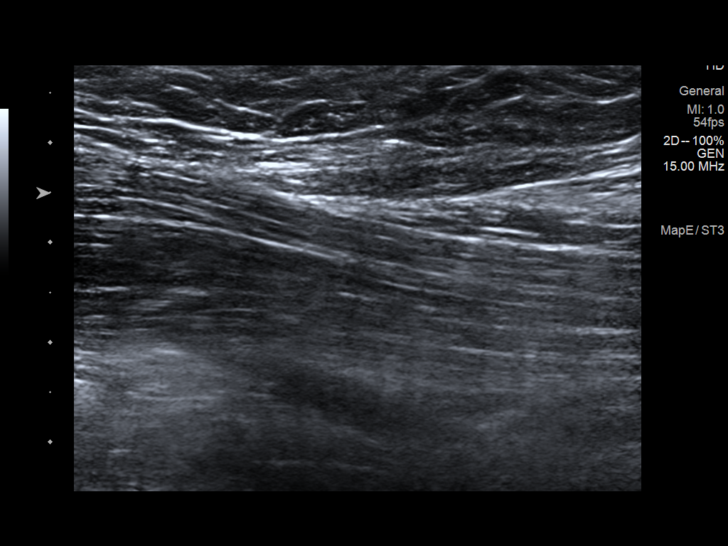
[im 6/12]
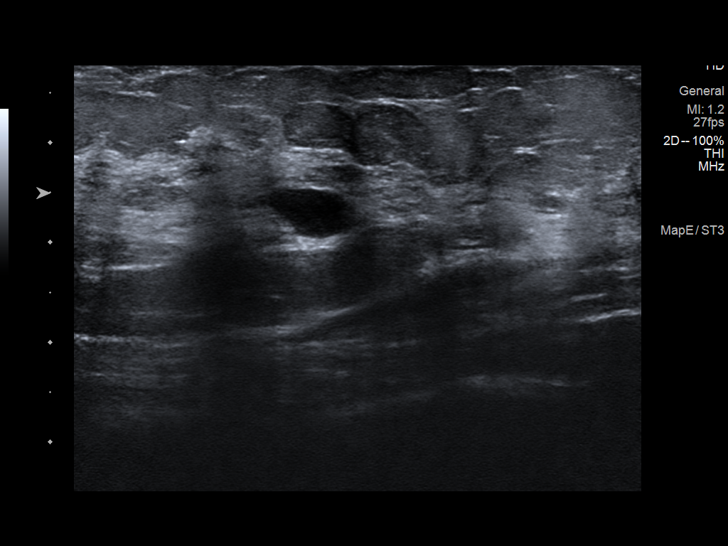
[im 7/12]
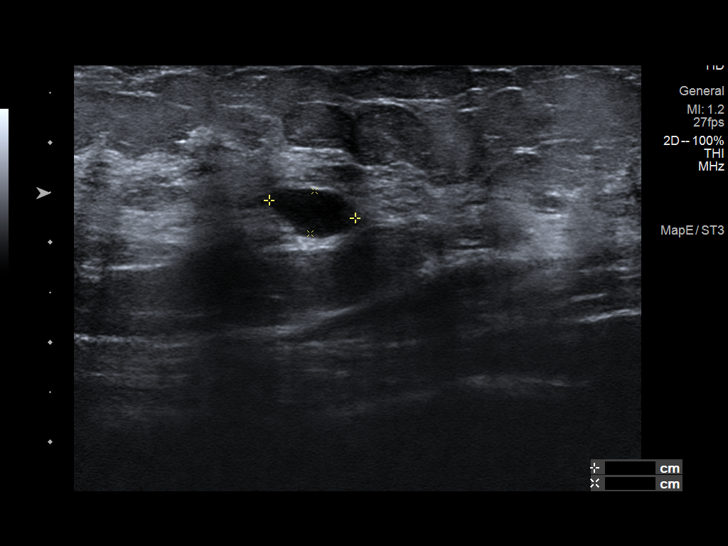
[im 8/12]
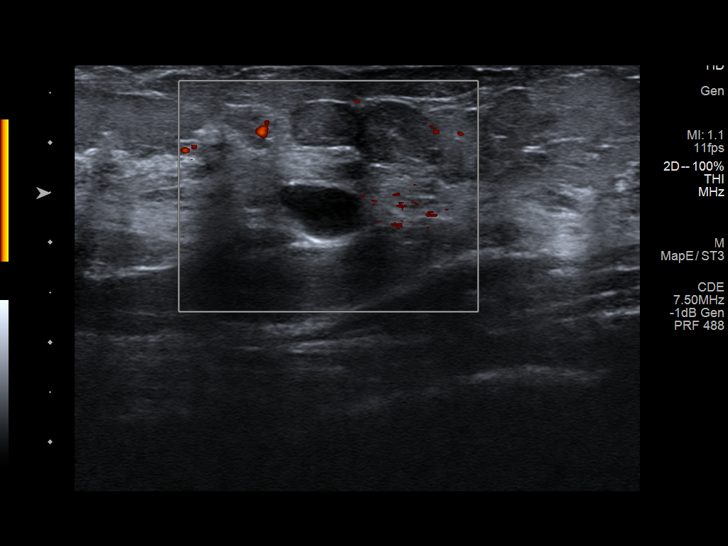
[im 9/12]
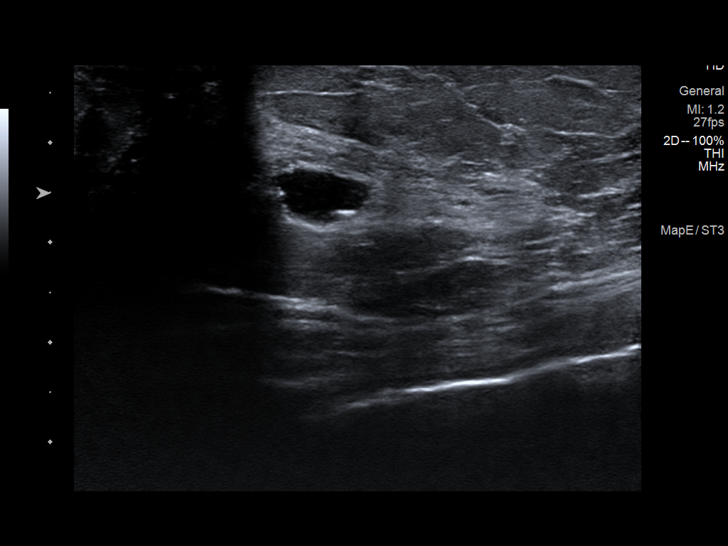
[im 10/12]
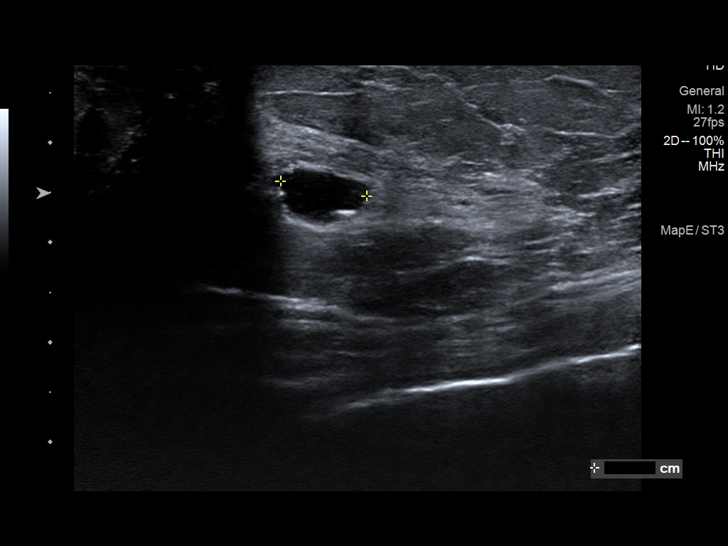
[im 11/12]
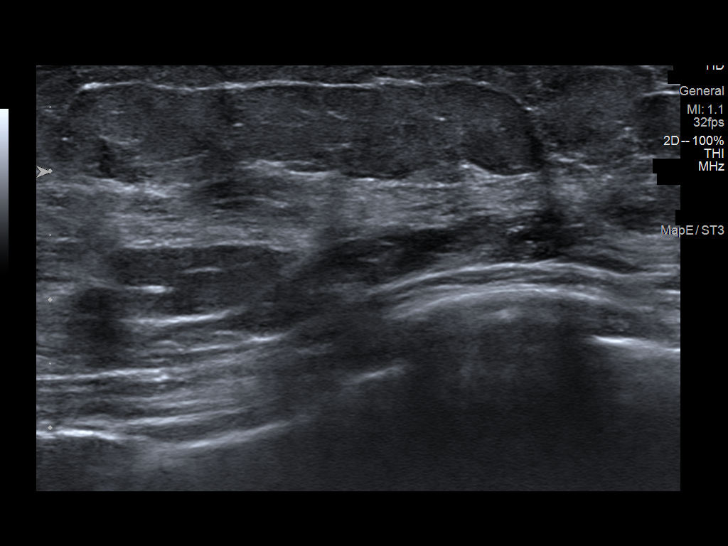
[im 12/12]
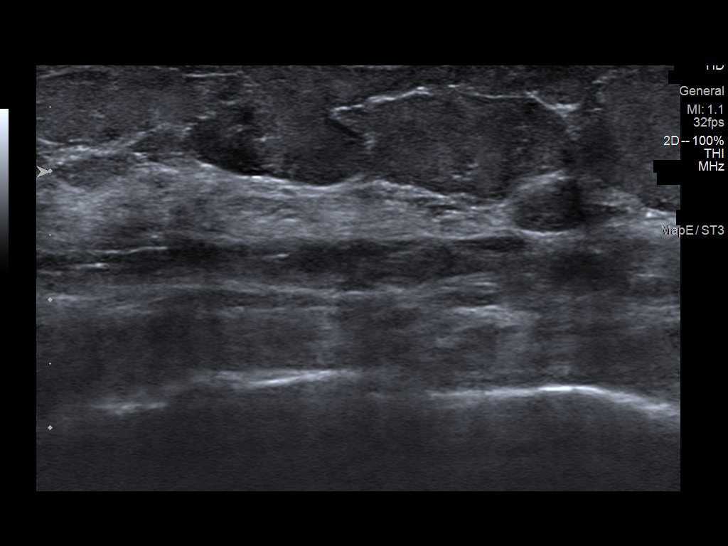

[12 of 12 positions shown; findings below may reference images not displayed]

ACR Breast Density Category c: The breast tissue is heterogeneously
dense, which may obscure small masses.
FINDINGS: There are BBs on the breasts bilaterally adjacent to the nipple
indicating the palpable sites of concern.

In the upper-outer quadrant of the left breast, there is a broad
area of loosely grouped calcifications which layer on the true
lateral view consistent with benign milk of calcium. There are
similar appearing calcifications in the upper-outer quadrant of the
right breast spanning at least 4 mm, however I suspect there are
some amorphous calcifications more posterior that are difficult to
see clearly. These do not definitely layer on the lateral view,
though milk of calcium is suspected.

There is an asymmetry in the lower outer quadrant of the left
breast, posterior depth. No underlying masses or areas of distortion
are identified on the spot compression tomosynthesis images.

There is a circumscribed oval mass in the lower outer quadrant of
the left breast, middle depth which measures approximately 1 cm.
There appears to be layering calcifications with in the mass
suggestive of a benign cyst.

Mammographic images were processed with CAD.

On physical exam, a firm ridge of tissue is palpated along the
medial aspect of the left areola, without a discrete palpable mass.
No suspicious palpable findings are identified in the superior
retroareolar right breast.

Ultrasound of the retroareolar right breast demonstrates normal
fibroglandular tissue. Ultrasound of the right axilla demonstrates
normal-appearing lymph nodes. No suspicious masses or areas of
shadowing are identified.

Ultrasound of the periareolar left breast at the palpable site
demonstrates normal fibroglandular tissue. In the left breast at 3
o'clock, 1 cm from the nipple there is an oval circumscribed
anechoic mass measuring 9 x 4 x 9 mm, consistent with a benign cyst.
The remainder of the lower outer quadrant of the left breast was
scanned demonstrating normal dense fibroglandular tissue. No
suspicious masses are identified. Ultrasound of the left axilla also
demonstrates normal lymph nodes without suspicious mass or area of
shadowing.
IMPRESSION: 1. There are no suspicious mammographic or targeted sonographic
abnormalities at the bilateral palpable sites near the nipple and in
the bilateral axillae.

2. Likely benign calcifications in the upper-outer quadrant of the
right breast are favored to represent fibrocystic change. Benign
milk of calcium (within fibrocystic change) is noted in the
upper-outer left breast.

3.  There is a benign cyst in the left breast at 3 o'clock.

4.  No mammographic evidence of malignancy in the bilateral breasts.

RECOMMENDATION:
1. Six-month follow-up diagnostic right breast mammogram is
recommended for the likely benign right breast calcifications.

2. Clinical follow-up recommended for the bilateral palpable lumps.
Any further workup should be based on clinical grounds.

I have discussed the findings and recommendations with the patient.
If applicable, a reminder letter will be sent to the patient
regarding the next appointment.

BI-RADS CATEGORY  3: Probably benign.

## 2020-04-03 ENCOUNTER — Other Ambulatory Visit: Payer: Self-pay

## 2020-04-03 ENCOUNTER — Ambulatory Visit
Admission: RE | Admit: 2020-04-03 | Discharge: 2020-04-03 | Disposition: A | Discharge status: Home / Self Care | Payer: 59 | Source: Ambulatory Visit | Attending: Obstetrics and Gynecology | Admitting: Obstetrics and Gynecology

## 2020-04-03 DIAGNOSIS — R921 Mammographic calcification found on diagnostic imaging of breast: Secondary | ICD-10-CM

## 2020-04-23 ENCOUNTER — Other Ambulatory Visit: Payer: Self-pay

## 2020-04-23 ENCOUNTER — Other Ambulatory Visit: Payer: Self-pay | Admitting: Obstetrics and Gynecology

## 2020-04-23 ENCOUNTER — Ambulatory Visit
Admission: RE | Admit: 2020-04-23 | Discharge: 2020-04-23 | Disposition: A | Discharge status: Home / Self Care | Payer: 59 | Source: Ambulatory Visit | Attending: Obstetrics and Gynecology | Admitting: Obstetrics and Gynecology

## 2020-04-23 DIAGNOSIS — R921 Mammographic calcification found on diagnostic imaging of breast: Secondary | ICD-10-CM

## 2020-04-23 IMAGING — MG MM DIGITAL DIAGNOSTIC UNILAT*R* W/ TOMO W/ CAD
6 series · 6 of 14 positions shown · non-contrast
Comparison: Previous exam(s).

CLINICAL DATA: 59-year-old female for six-month follow-up of RIGHT
breast calcifications.

EXAM:
DIGITAL DIAGNOSTIC UNILATERAL RIGHT MAMMOGRAM WITH TOMO AND CAD

[R ML]
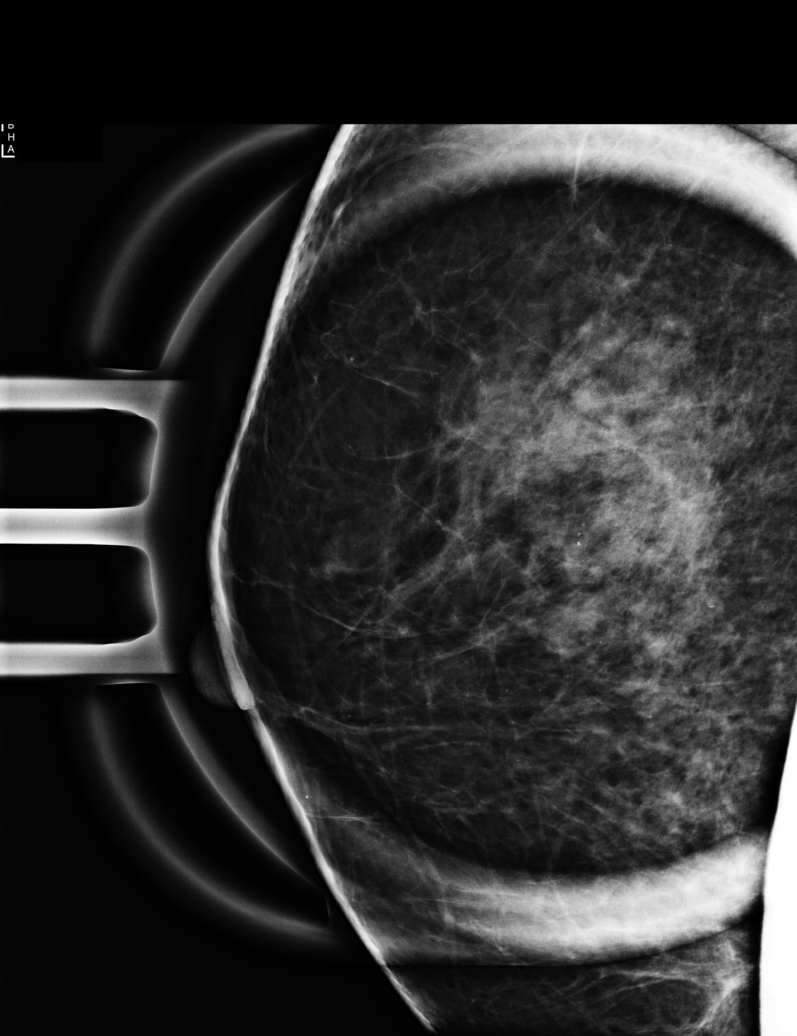

[R CC]
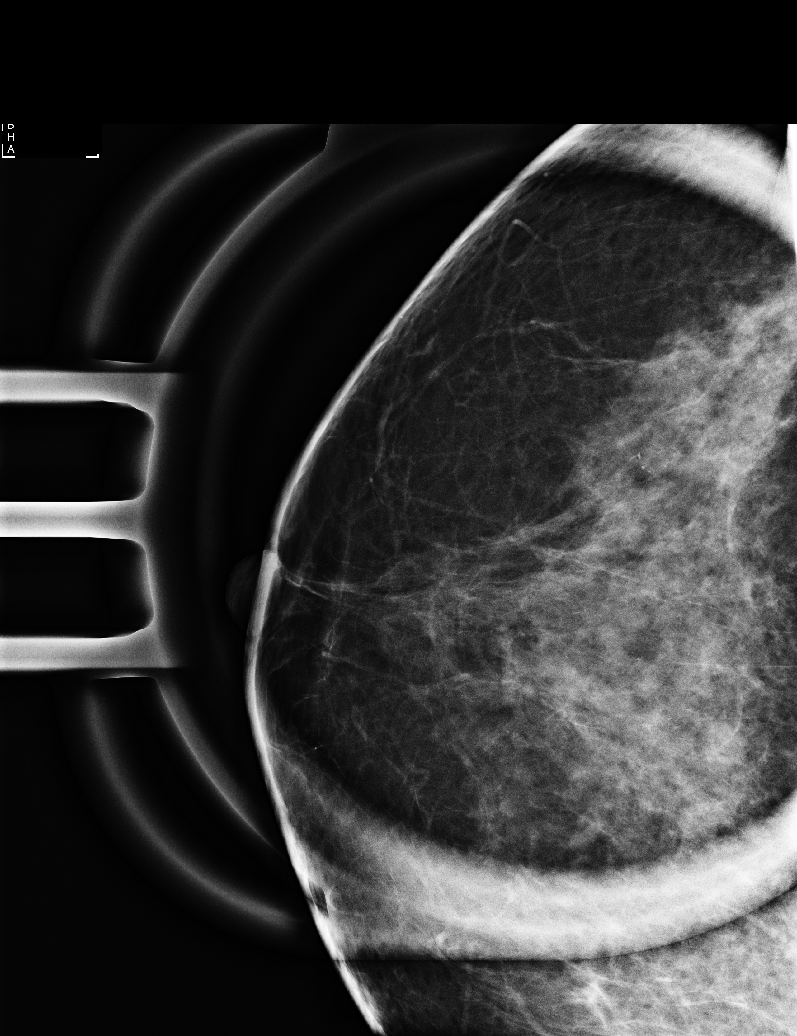

[R MLO synth-2D]
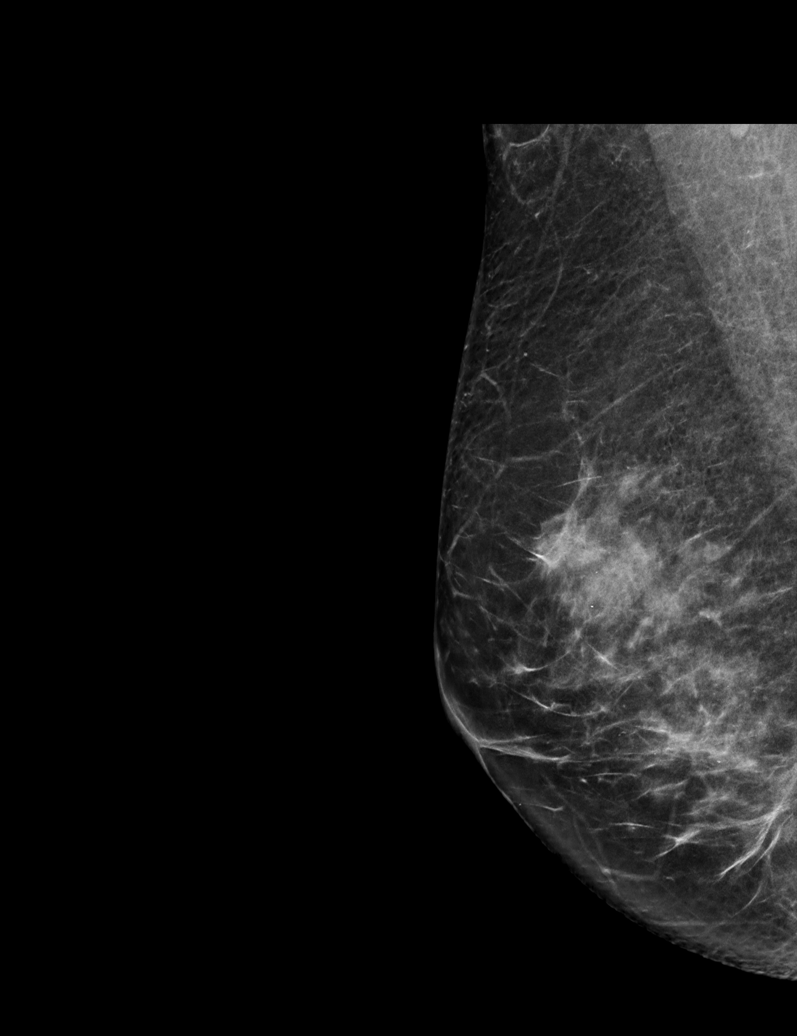

[R CC synth-2D]
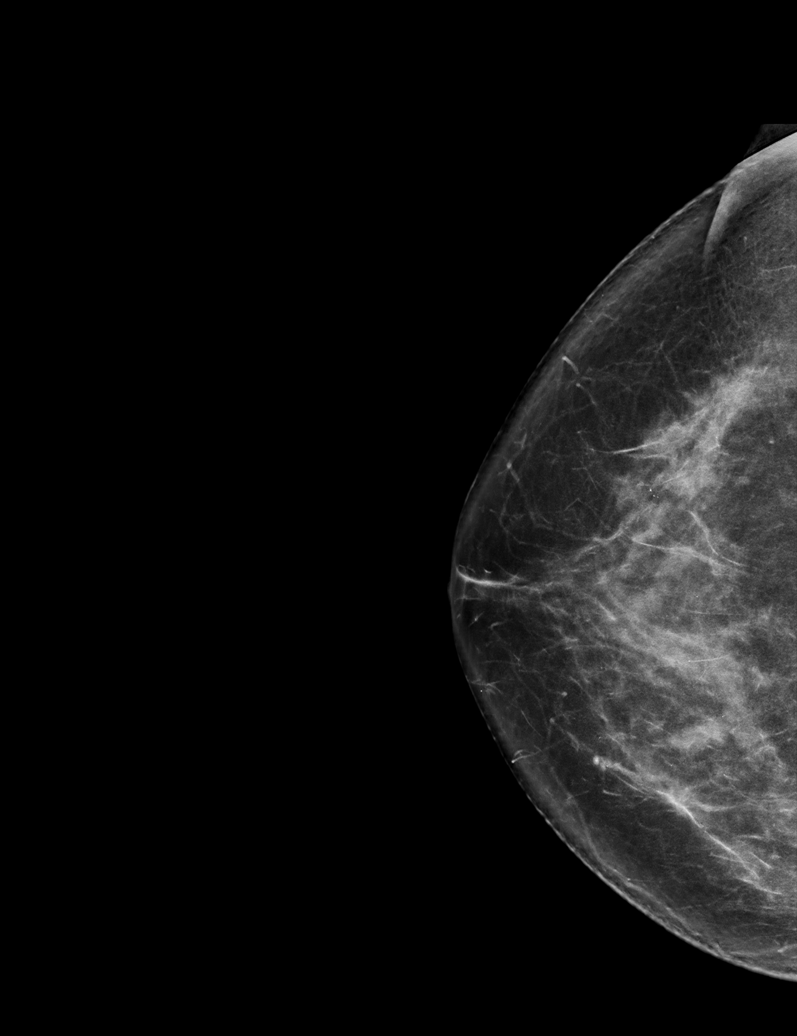

[R CC tomo · tomo slice 37/73.0]
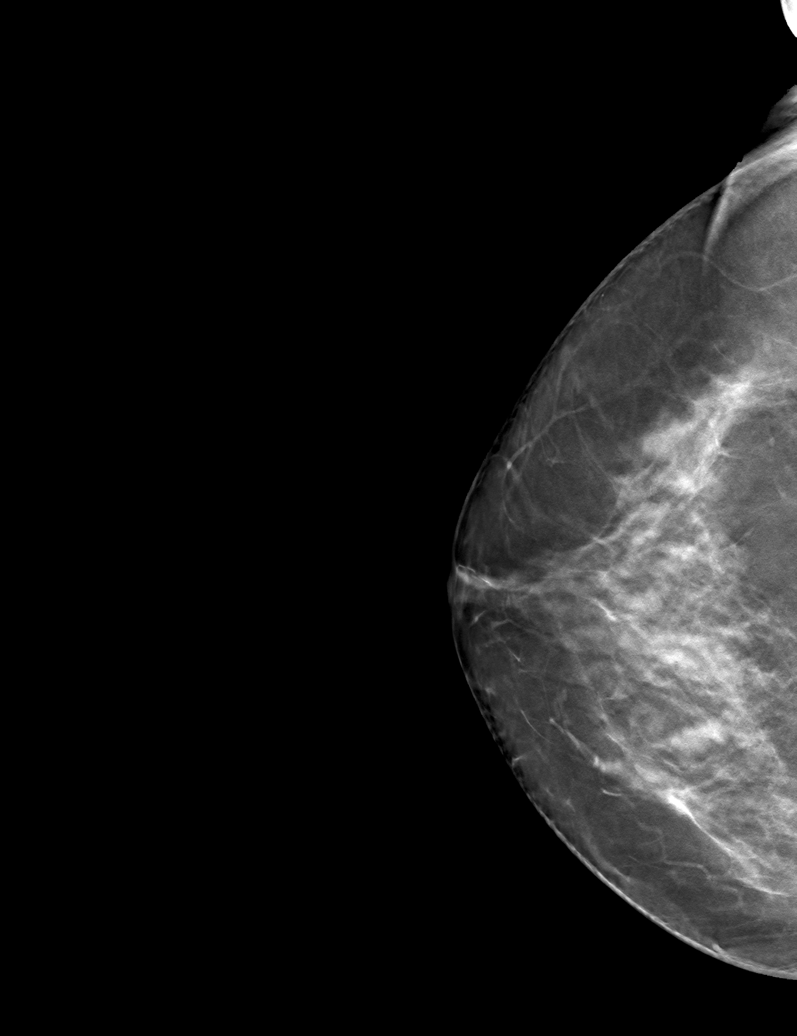

[R MLO tomo · tomo slice 35/69.0]
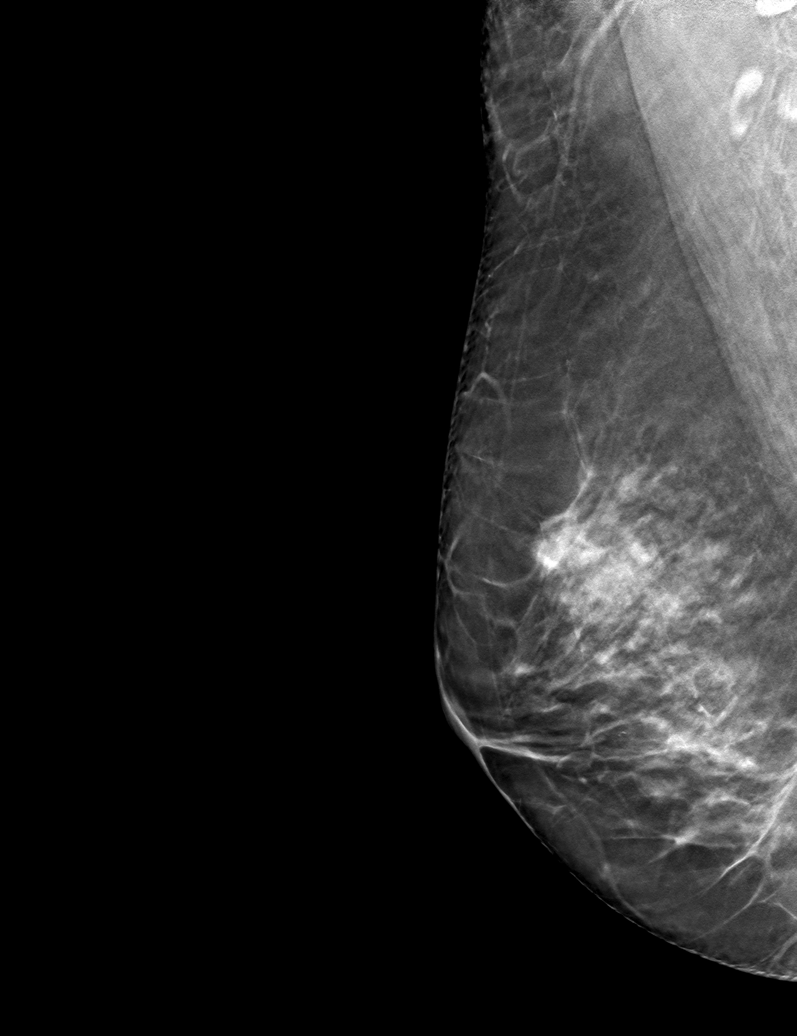

[6 of 14 positions shown; findings below may reference images not displayed]

ACR Breast Density Category c: The breast tissue is heterogeneously
dense, which may obscure small masses.
FINDINGS: 2D/3D full field views and magnification views of the RIGHT breast
demonstrate unchanged loosely grouped primarily round calcifications
within the OUTER RIGHT breast, on today's images spanning a distance
of up to 1.8 cm. No new or suspicious abnormalities are noted.

Mammographic images were processed with CAD.
IMPRESSION: Stable likely benign OUTER RIGHT breast calcifications. Six-month
follow-up recommended to ensure 1 year stability.

RECOMMENDATION:
Bilateral diagnostic mammogram with magnification views of the RIGHT
breast in 6 months.

I have discussed the findings and recommendations with the patient.
If applicable, a reminder letter will be sent to the patient
regarding the next appointment.

BI-RADS CATEGORY  3: Probably benign.

## 2020-10-31 ENCOUNTER — Other Ambulatory Visit: Payer: Self-pay

## 2020-10-31 ENCOUNTER — Ambulatory Visit
Admission: RE | Admit: 2020-10-31 | Discharge: 2020-10-31 | Disposition: A | Discharge status: Home / Self Care | Payer: 59 | Source: Ambulatory Visit | Attending: Obstetrics and Gynecology | Admitting: Obstetrics and Gynecology

## 2020-10-31 DIAGNOSIS — R921 Mammographic calcification found on diagnostic imaging of breast: Secondary | ICD-10-CM

## 2020-10-31 IMAGING — MG DIGITAL DIAGNOSTIC BILAT W/ TOMO W/ CAD
6 of 10 series · 6 of 26 positions shown · non-contrast
Comparison: Previous exam(s).

CLINICAL DATA: 59-year-old female presenting for annual bilateral
mammogram and 1 year follow-up of probably benign right breast
calcifications.

EXAM:
DIGITAL DIAGNOSTIC BILATERAL MAMMOGRAM WITH TOMOSYNTHESIS AND CAD
TECHNIQUE: Bilateral digital diagnostic mammography and breast tomosynthesis
was performed. The images were evaluated with computer-aided
detection.

[R ML]
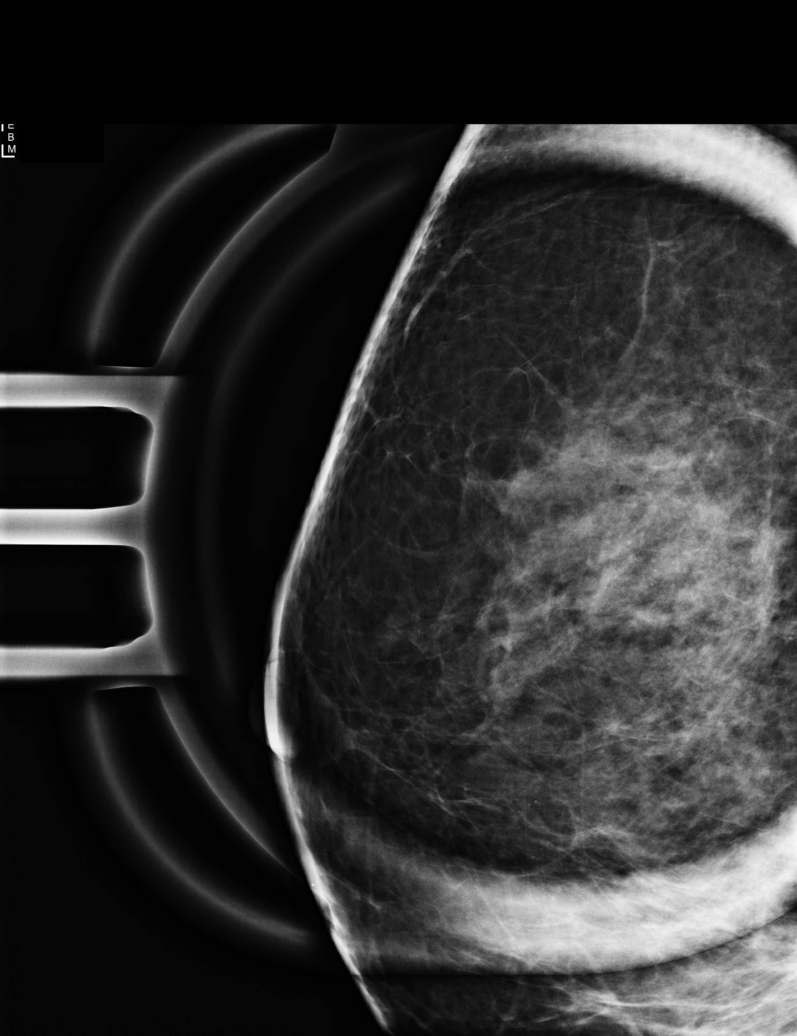

[R CC]
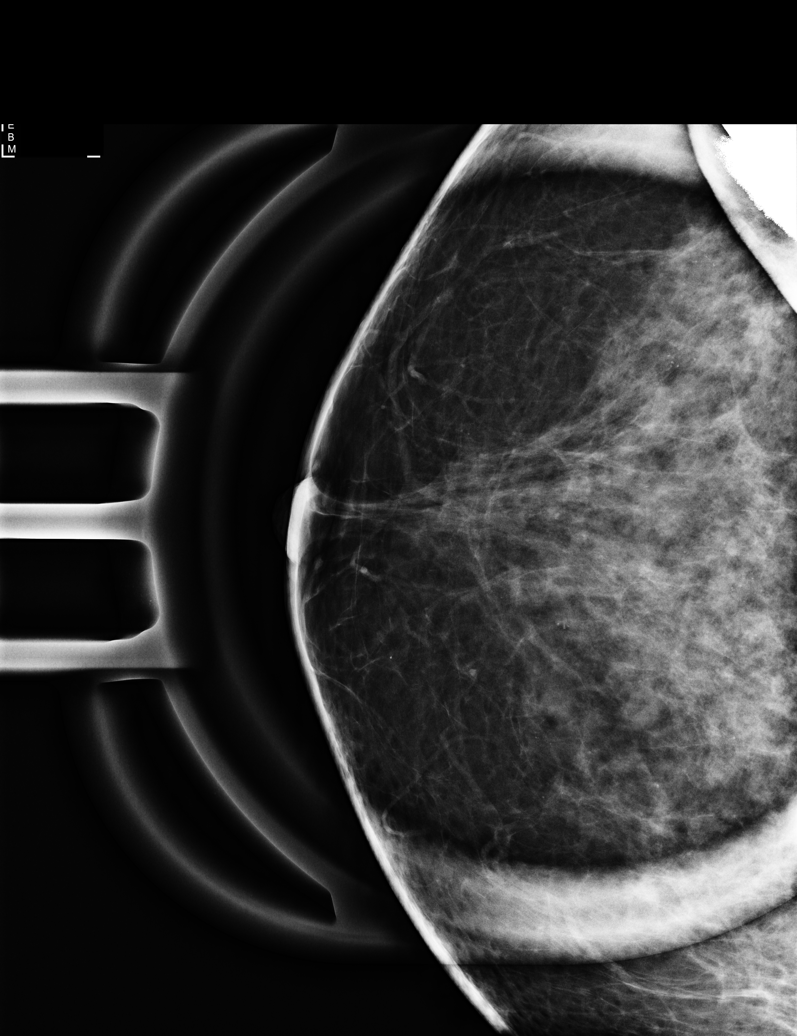

[R MLO synth-2D]
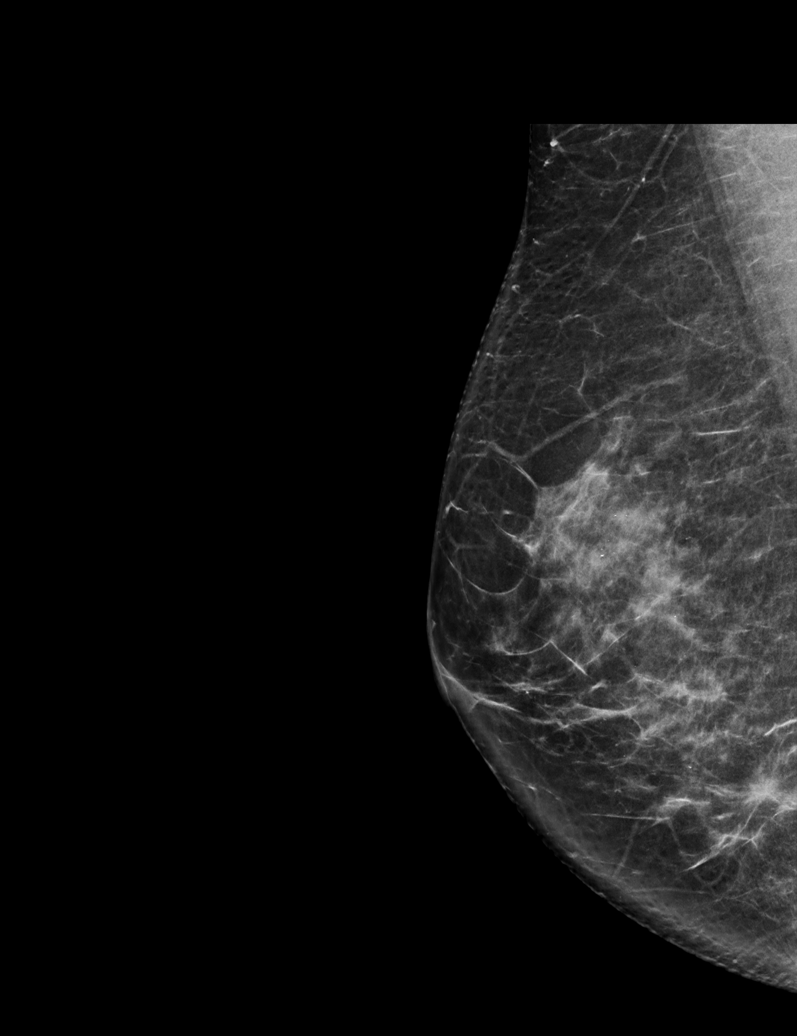

[L CC synth-2D]
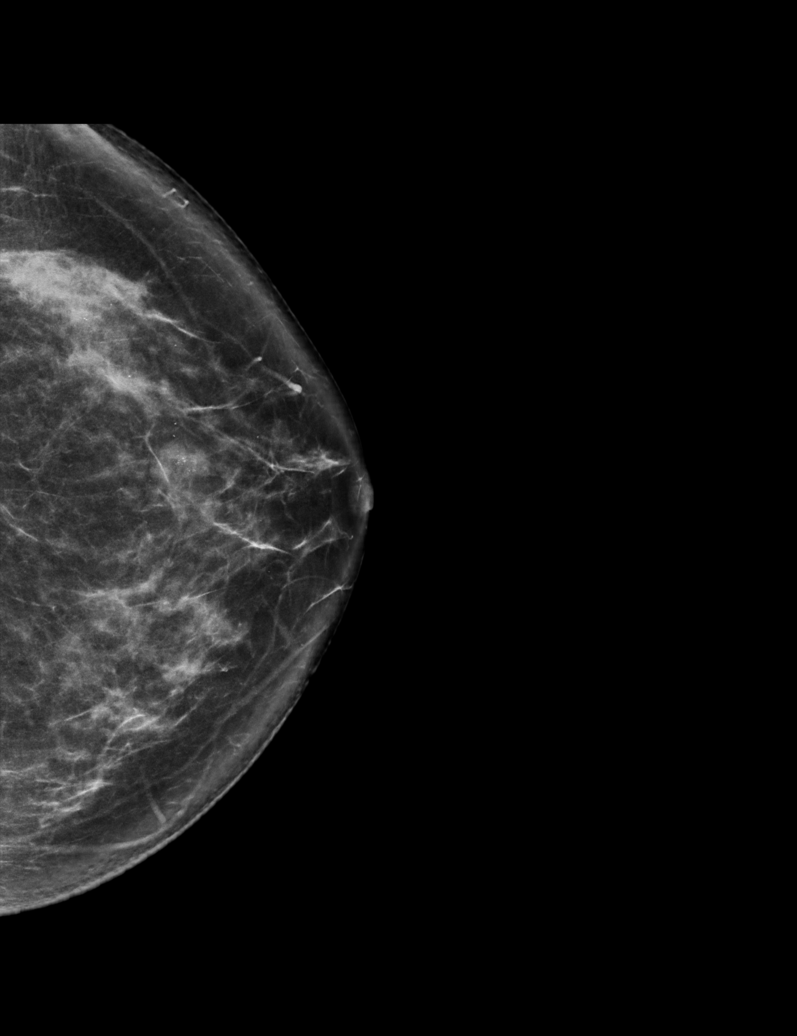

[R CC synth-2D]
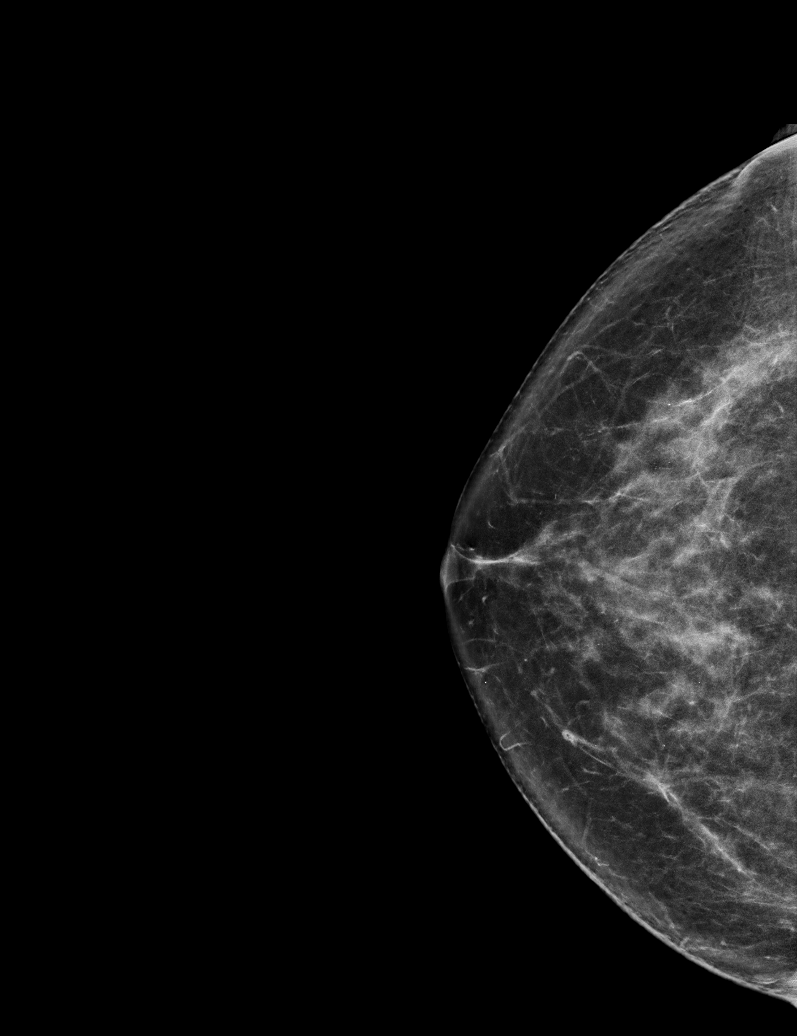

[L MLO synth-2D]
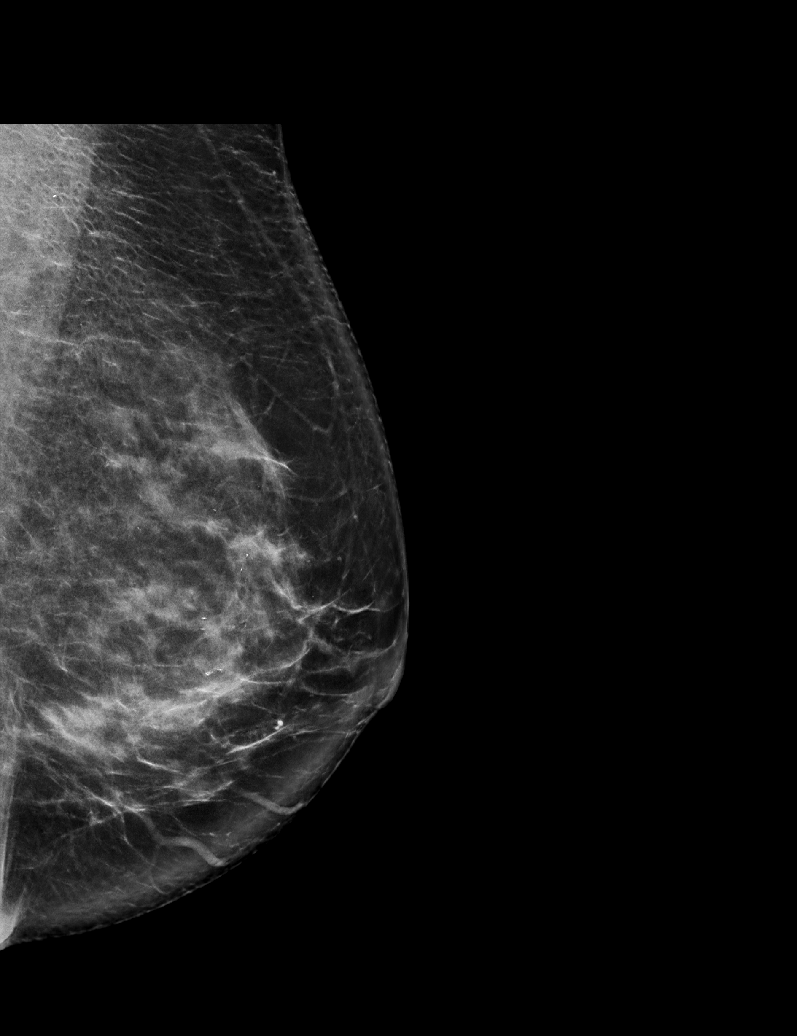

[6 of 26 positions shown; findings below may reference images not displayed]

ACR Breast Density Category c: The breast tissue is heterogeneously
dense, which may obscure small masses.
FINDINGS: Loosely grouped round and punctate calcifications in the outer right
breast are mammographically stable. No new or suspicious
mammographic findings are identified in the remainder of either
breast. The parenchymal pattern is stable.
IMPRESSION: 1. Stable, probably benign right breast calcifications. Recommend a
final mammographic follow-up in 1 year.
2. No mammographic evidence of malignancy on the left.

RECOMMENDATION:
Bilateral diagnostic mammogram in 1 year.

I have discussed the findings and recommendations with the patient.
If applicable, a reminder letter will be sent to the patient
regarding the next appointment.

BI-RADS CATEGORY  3: Probably benign.

## 2020-11-28 ENCOUNTER — Telehealth: Payer: Self-pay

## 2020-11-28 NOTE — Telephone Encounter (Signed)
Notes on file.  

## 2021-02-20 ENCOUNTER — Encounter: Payer: Self-pay | Admitting: Cardiology

## 2021-02-20 ENCOUNTER — Ambulatory Visit (INDEPENDENT_AMBULATORY_CARE_PROVIDER_SITE_OTHER): Payer: PRIVATE HEALTH INSURANCE | Admitting: Cardiology

## 2021-02-20 ENCOUNTER — Other Ambulatory Visit: Payer: Self-pay

## 2021-02-20 VITALS — BP 110/72 | HR 34 | Ht 66.0 in | Wt 154.0 lb

## 2021-02-20 DIAGNOSIS — I459 Conduction disorder, unspecified: Secondary | ICD-10-CM

## 2021-02-20 NOTE — Patient Instructions (Signed)
Medication Instructions:  Your physician recommends that you continue on your current medications as directed. Please refer to the Current Medication list given to you today.  *If you need a refill on your cardiac medications before your next appointment, please call your pharmacy*   Lab Work: TODAY: CMET, CBC, PT/INR, TSH, Magnesium  If you have labs (blood work) drawn today and your tests are completely normal, you will receive your results only by: MyChart Message (if you have MyChart) OR A paper copy in the mail If you have any lab test that is abnormal or we need to change your treatment, we will call you to review the results.   Testing/Procedures: Your physician has requested that you have an echocardiogram. Echocardiography is a painless test that uses sound waves to create images of your heart. It provides your doctor with information about the size and shape of your heart and how well your heart's chambers and valves are working. This procedure takes approximately one hour. There are no restrictions for this procedure.   Follow-Up: At [facility], you and your health needs are our priority.  As part of our continuing mission to provide you with exceptional heart care, we have created designated Provider Care Teams.  These Care Teams include your primary Cardiologist (physician) and Advanced Practice Providers (APPs -  Physician Assistants and Nurse Practitioners) who all work together to provide you with the care you need, when you need it.  Your next appointment:   Monday, July 11th at 8:30am  The format for your next appointment:   In Person  Provider:   Lewayne Bunting, MD

## 2021-02-20 NOTE — Progress Notes (Signed)
Cardiology CONSULT Note    Date:  02/23/2021   ID:  Janet Mclaughlin, DOB 14-Aug-1961, MRN [id number]  PCP:  Patient, No Pcp Per (Inactive)  Cardiologist:  Armanda Magic, MD   No chief complaint on file.   History of Present Illness:  Janet Mclaughlin is a 60 y.o. female who is being seen today for the evaluation of bradycardia at the request of Lynden Ang, NP.  This is a 60yo female with no prior cardiac hx except for bradycardia who is now referred for evaluation of her bradycardia by her PCP.  She denies any dizziness or lightheadedness.  She has not had any palpitations but has noticed some exertional fatigue.  She has a fit bit that has been registering slow heart beats for a long time.  She recently had a colonoscopy and while taking her VS was noted to have a slow heart beat.  The colonoscopy was cancelled and she was referred for cardiac evaluation,.  She denies any chest pain or pressure, SOB, DOE, PND, orthopnea, LE edema or syncope.   Past Medical History:  Diagnosis Date   Body mass index (BMI) 25.0-25.9, adult    Bradycardia    Heart murmur    Overweight     Past Surgical History:  Procedure Laterality Date   NO PAST SURGERIES      Current Medications: Current Meds  Medication Sig   Multiple Vitamin (MULTIVITAMIN) tablet Take 1 tablet by mouth daily.    Allergies:   Patient has no known allergies.   Social History   Socioeconomic History   Marital status: Married    Spouse name: Not on file   Number of children: Not on file   Years of education: Not on file   Highest education level: Not on file  Occupational History   Not on file  Tobacco Use   Smoking status: Never   Smokeless tobacco: Never  Substance and Sexual Activity   Alcohol use: Yes    Comment: 3-4 drinks/ week   Drug use: No   Sexual activity: Yes    Partners: Male  Other Topics Concern   Not on file  Social History Narrative   Not on file   Social Determinants of Health    Financial Resource Strain: Not on file  Food Insecurity: Not on file  Transportation Needs: Not on file  Physical Activity: Not on file  Stress: Not on file  Social Connections: Not on file     Family History:  The patient'sfamily history includes Breast cancer in her maternal grandmother; Cancer in her maternal grandfather; Diabetes in her father; Heart Problems in her son; Hypertension in her mother; Polycythemia in her brother.   ROS:   Please see the history of present illness.    ROS All other systems reviewed and are negative.  No flowsheet data found.     PHYSICAL EXAM:   VS:  BP 110/72   Pulse (!) 34   Ht 5\' 6"  (1.676 m)   Wt 154 lb (69.9 kg)   SpO2 95%   BMI 24.86 kg/m    GEN: Well nourished, well developed, in no acute distress  HEENT: normal  Neck: no JVD, carotid bruits, or masses Cardiac: regular and bradycardic; no murmurs, rubs, or gallops,no edema.  Intact distal pulses bilaterally.  Respiratory:  clear to auscultation bilaterally, normal work of breathing GI: soft, nontender, nondistended, + BS MS: no deformity or atrophy  Skin: warm and dry, no rash Neuro:  Alert and Oriented x 3, Strength and sensation are intact Psych: euthymic mood, full affect  Wt Readings from Last 3 Encounters:  02/23/21 154 lb (69.9 kg)  02/20/21 154 lb (69.9 kg)  08/24/15 145 lb (65.8 kg)      Studies/Labs Reviewed:   EKG:  EKG is ordered today.  The ekg ordered today demonstrates complete heart block with ventricular escape rhythm  Recent Labs: 02/20/2021: ALT 20; BUN 15; Creatinine, Ser 0.90; Hemoglobin 14.0; Magnesium 2.1; Platelets 285; Potassium 4.3; Sodium 141; TSH 3.650   Lipid Panel No results found for: CHOL, TRIG, HDL, CHOLHDL, VLDL, LDLCALC, LDLDIRECT  Additional studies/ records that were reviewed today include:  EKG an office notes from PCP    ASSESSMENT:    1. Heart block      PLAN:  In order of problems listed above:  Complete heart  block -unclear how long this has been present -she says that her heart rate has always been slow -she has never had an EKG done before  -Noted to be very bradycardia at time of colonoscopy which was cancelled -EKG today demonstrates sinus bradycardia with blocked PACs and complete heart block and ventricular escape rhythm in the low 30's.   -she is mildly symptomatic and has noticed some exercise intolerance but no dizziness, presyncope, syncope, SOB or chest pain.  -I have reviewed the EKG with Dr. Jeanette Caprice she is relatively minimal symptomatic and hemodynamically stable he will see her on Monday in the office for assessment for need for PPM -I will check a CMET, CBC, Mag, TSH today -check 2D echo to assess for structural heart disease   Time Spent: 35 minutes total time of encounter, including 25 minutes spent in face-to-face patient care on the date of this encounter. This time includes coordination of care and counseling regarding above mentioned problem list. Remainder of non-face-to-face time involved reviewing chart documents/testing relevant to the patient encounter and documentation in the medical record. I have independently reviewed documentation from referring provider  Medication Adjustments/Labs and Tests Ordered: Current medicines are reviewed at length with the patient today.  Concerns regarding medicines are outlined above.  Medication changes, Labs and Tests ordered today are listed in the Patient Instructions below.  Patient Instructions  Medication Instructions:  Your physician recommends that you continue on your current medications as directed. Please refer to the Current Medication list given to you today.  *If you need a refill on your cardiac medications before your next appointment, please call your pharmacy*   Lab Work: TODAY: CMET, CBC, PT/INR, TSH, Magnesium  If you have labs (blood work) drawn today and your tests are completely normal, you will receive  your results only by: MyChart Message (if you have MyChart) OR A paper copy in the mail If you have any lab test that is abnormal or we need to change your treatment, we will call you to review the results.   Testing/Procedures: Your physician has requested that you have an echocardiogram. Echocardiography is a painless test that uses sound waves to create images of your heart. It provides your doctor with information about the size and shape of your heart and how well your heart's chambers and valves are working. This procedure takes approximately one hour. There are no restrictions for this procedure.   Follow-Up: At [facility], you and your health needs are our priority.  As part of our continuing mission to provide you with exceptional heart care, we have created designated Provider Care Teams.  These  Care Teams include your primary Cardiologist (physician) and Advanced Practice Providers (APPs -  Physician Assistants and Nurse Practitioners) who all work together to provide you with the care you need, when you need it.  Your next appointment:   Monday, July 11th at 8:30am  The format for your next appointment:   In Person  Provider:   Lewayne Bunting, MD     Signed, Armanda Magic, MD  02/23/2021 10:05 AM    [facility] [address] [place], [place], [place]  [address] Phone: [phone]; Fax: [phone]

## 2021-02-21 LAB — CBC
Hematocrit: 41.6 % (ref 34.0–46.6)
Hemoglobin: 14 g/dL (ref 11.1–15.9)
MCH: 30.5 pg (ref 26.6–33.0)
MCHC: 33.7 g/dL (ref 31.5–35.7)
MCV: 91 fL (ref 79–97)
Platelets: 285 10*3/uL (ref 150–450)
RBC: 4.59 x10E6/uL (ref 3.77–5.28)
RDW: 13.4 % (ref 11.7–15.4)
WBC: 6.1 10*3/uL (ref 3.4–10.8)

## 2021-02-21 LAB — COMPREHENSIVE METABOLIC PANEL
ALT: 20 IU/L (ref 0–32)
AST: 16 IU/L (ref 0–40)
Albumin/Globulin Ratio: 2 (ref 1.2–2.2)
Albumin: 4.7 g/dL (ref 3.8–4.9)
Alkaline Phosphatase: 81 IU/L (ref 44–121)
BUN/Creatinine Ratio: 17 (ref 9–23)
BUN: 15 mg/dL (ref 6–24)
Bilirubin Total: 0.5 mg/dL (ref 0.0–1.2)
CO2: 25 mmol/L (ref 20–29)
Calcium: 9.8 mg/dL (ref 8.7–10.2)
Chloride: 104 mmol/L (ref 96–106)
Creatinine, Ser: 0.9 mg/dL (ref 0.57–1.00)
Globulin, Total: 2.3 g/dL (ref 1.5–4.5)
Glucose: 94 mg/dL (ref 65–99)
Potassium: 4.3 mmol/L (ref 3.5–5.2)
Sodium: 141 mmol/L (ref 134–144)
Total Protein: 7 g/dL (ref 6.0–8.5)
eGFR: 75 mL/min/{1.73_m2} (ref >59)

## 2021-02-21 LAB — PROTIME-INR
INR: 1 (ref 0.9–1.2)
Prothrombin Time: 10.3 s (ref 9.1–12.0)

## 2021-02-21 LAB — MAGNESIUM: Magnesium: 2.1 mg/dL (ref 1.6–2.3)

## 2021-02-21 LAB — TSH: TSH: 3.65 u[IU]/mL (ref 0.450–4.500)

## 2021-02-23 ENCOUNTER — Telehealth: Payer: Self-pay | Admitting: Cardiology

## 2021-02-23 ENCOUNTER — Ambulatory Visit (INDEPENDENT_AMBULATORY_CARE_PROVIDER_SITE_OTHER): Payer: PRIVATE HEALTH INSURANCE

## 2021-02-23 ENCOUNTER — Encounter: Payer: Self-pay | Admitting: Internal Medicine

## 2021-02-23 ENCOUNTER — Other Ambulatory Visit: Payer: Self-pay

## 2021-02-23 ENCOUNTER — Ambulatory Visit (INDEPENDENT_AMBULATORY_CARE_PROVIDER_SITE_OTHER): Payer: PRIVATE HEALTH INSURANCE | Admitting: Internal Medicine

## 2021-02-23 DIAGNOSIS — R001 Bradycardia, unspecified: Secondary | ICD-10-CM

## 2021-02-23 NOTE — Telephone Encounter (Signed)
Patient is asking that Dr Mayford Knife give her a call on what dr her son can see. Patient said she spoke to dr turner already about her son having open heart surgery. Please advise

## 2021-02-23 NOTE — Patient Instructions (Addendum)
Medication Instructions:  Your physician recommends that you continue on your current medications as directed. Please refer to the Current Medication list given to you today.  Labwork: None ordered.  Testing/Procedures: Your physician has recommended that you wear a holter monitor. Holter monitors are medical devices that record the heart's electrical activity. Doctors most often use these monitors to diagnose arrhythmias. Arrhythmias are problems with the speed or rhythm of the heartbeat. The monitor is a small, portable device. You can wear one while you do your normal daily activities. This is usually used to diagnose what is causing palpitations/syncope (passing out).  You will wear a ZIO monitor for 3 days.  Follow-Up: Your physician wants you to follow-up with Lewayne Bunting, MD based on results of your Echo and ZIO monitor results.  Any Other Special Instructions Will Be Listed Below (If Applicable).  If you need a refill on your cardiac medications before your next appointment, please call your pharmacy.   Your physician has recommended that you wear a Zio monitor.   This monitor is a medical device that records the heart's electrical activity. Doctors most often use these monitors to diagnose arrhythmias. Arrhythmias are problems with the speed or rhythm of the heartbeat. The monitor is a small device applied to your chest. You can wear one while you do your normal daily activities. While wearing this monitor if you have any symptoms to push the button and record what you felt. Once you have worn this monitor for the period of time provider prescribed (Usually 14 days), you will return the monitor device in the postage paid box. Once it is returned they will download the data collected and provide [place] with a report which the provider will then review and we will call you with those results. Important tips:  Avoid showering during the first 24 hours of wearing the monitor. Avoid excessive  sweating to help maximize wear time. Do not submerge the device, no hot tubs, and no swimming pools. Keep any lotions or oils away from the patch. After 24 hours you may shower with the patch on. Take brief showers with your back facing the shower head.  Do not remove patch once it has been placed because that will interrupt data and decrease adhesive wear time. Push the button when you have any symptoms and write down what you were feeling. Once you have completed wearing your monitor, remove and place into box which has postage paid and place in your outgoing mailbox.  If for some reason you have misplaced your box then call our office and we can provide another box and/or mail it off for you.

## 2021-02-23 NOTE — Progress Notes (Signed)
HPI Janet Mclaughlin is referred today by Dr. Mayford Knife for evaluation of CHB. She is a well 60 yo woman with a h/o 3 fairly normal pregnancies who has been bradycardic for a couple of years based on her Apple watch. She did not seek medical attention. She was undergoing screening for colonoscopy and found to have bradycardic and subsequent ECG demonstrates CHB with a ventricular escape in the low 30's. She has never had syncope, dizziness or near syncope.  No Known Allergies   Current Outpatient Medications  Medication Sig Dispense Refill   Multiple Vitamin (MULTIVITAMIN) tablet Take 1 tablet by mouth daily.     No current facility-administered medications for this visit.     Past Medical History:  Diagnosis Date   Body mass index (BMI) 25.0-25.9, adult    Bradycardia    Heart murmur    Overweight     ROS:   All systems reviewed and negative except as noted in the HPI.   Past Surgical History:  Procedure Laterality Date   NO PAST SURGERIES       Family History  Problem Relation Age of Onset   Breast cancer Maternal Grandmother    Hypertension Mother    Diabetes Father    Polycythemia Brother    Heart Problems Son        VENTRICULAR SEPTAL DEFECT   Cancer Maternal Grandfather        BILE DUCT     Social History   Socioeconomic History   Marital status: Married    Spouse name: Not on file   Number of children: Not on file   Years of education: Not on file   Highest education level: Not on file  Occupational History   Not on file  Tobacco Use   Smoking status: Never   Smokeless tobacco: Never  Substance and Sexual Activity   Alcohol use: Yes    Comment: 3-4 drinks/ week   Drug use: No   Sexual activity: Yes    Partners: Male  Other Topics Concern   Not on file  Social History Narrative   Not on file   Social Determinants of Health   Financial Resource Strain: Not on file  Food Insecurity: Not on file  Transportation Needs: Not on file   Physical Activity: Not on file  Stress: Not on file  Social Connections: Not on file  Intimate Partner Violence: Not on file     BP (!) 120/54   Pulse (!) 37   Ht 5\' 6"  (1.676 m)   Wt 154 lb (69.9 kg)   BMI 24.86 kg/m   Physical Exam:  Well appearing NAD HEENT: Unremarkable Neck:  No JVD, no thyromegally Lymphatics:  No adenopathy Back:  No CVA tenderness Lungs:  Clear with no wheezes HEART:  Regular rate rhythm, no murmurs, no rubs, no clicks Abd:  soft, positive bowel sounds, no organomegally, no rebound, no guarding Ext:  2 plus pulses, no edema, no cyanosis, no clubbing Skin:  No rashes no nodules Neuro:  CN II through XII intact, motor grossly intact  EKG - reviewed. NSR with CHB   Assess/Plan:  CHB - I have discussed the issues of why she has CHB and what possible etiologies could have led to this. She is asymptomatic. I did discuss the very small increased risk of potentially dangerous ventricular arrhythmias as a consequence of her heart block. We will have her obtain a zio patch and a 2D echo. Additional testing will  depend on the results of the above tests. I reviewed PPM insertion but will discuss this more in the future after we have had a chance to review additional data.    Sharlot Gowda Amantha Sklar,MD

## 2021-02-23 NOTE — Telephone Encounter (Signed)
Spoke with the patient who states that when she saw Dr. Mayford Knife she mentioned that her son had open heart surgery and was not being followed by cardiology. She states that Dr. Mayford Knife recommended that he see a cardiologist annually. The patient would like for her son to be seen by Dr. Mayford Knife. I advised her that her son would need a referral and then we would get him scheduled.

## 2021-02-23 NOTE — Progress Notes (Unsigned)
Patient enrolled for [employer] to mail a 3 day ZIO XT monitor to address on file. 

## 2021-02-25 ENCOUNTER — Other Ambulatory Visit: Payer: Self-pay

## 2021-02-25 ENCOUNTER — Ambulatory Visit (HOSPITAL_COMMUNITY): Payer: PRIVATE HEALTH INSURANCE | Attending: Cardiology

## 2021-02-25 DIAGNOSIS — I442 Atrioventricular block, complete: Secondary | ICD-10-CM

## 2021-02-25 DIAGNOSIS — I459 Conduction disorder, unspecified: Secondary | ICD-10-CM | POA: Insufficient documentation

## 2021-02-25 LAB — ECHOCARDIOGRAM COMPLETE
Area-P 1/2: 2.56 cm2
S' Lateral: 3.4 cm

## 2021-02-25 NOTE — Progress Notes (Signed)
Echo showed normal heart function with mild MR and mildly calcified AV.  Patient in complete heart block during the study which has already been addressed and as seen EP

## 2021-02-28 DIAGNOSIS — R001 Bradycardia, unspecified: Secondary | ICD-10-CM

## 2021-03-03 ENCOUNTER — Telehealth: Payer: Self-pay | Admitting: Internal Medicine

## 2021-03-03 ENCOUNTER — Other Ambulatory Visit (HOSPITAL_COMMUNITY): Payer: PRIVATE HEALTH INSURANCE

## 2021-03-03 NOTE — Telephone Encounter (Signed)
    Pt would like to speak with Dr. Ladona Ridgel, she has questions about pacemaker procedure. She wanted to know when it should be done and what kind of activity she can do after the procedure

## 2021-03-03 NOTE — Telephone Encounter (Signed)
Janet Mclaughlin is calling back stating she is wanting to know when Dr. Ladona Ridgel thinks he will be doing the procedure if he does and is it okay to go on vacation 03/28/21 to the beach. Please advise.

## 2021-03-05 NOTE — Telephone Encounter (Signed)
Discussed case with SW with heart monitors.  Pt's heart monitor has not been placed in return mail at this time.  Call returned to Pt and left message advising per records HM has not been placed in mail.  Left message requesting call back.

## 2021-03-05 NOTE — Telephone Encounter (Signed)
Patient is returning call.  °

## 2021-03-05 NOTE — Telephone Encounter (Signed)
Call placed to Pt.  Advised that Dr. Ladona Ridgel was waiting on results of her HM before finalizing plan, but based on echo results may want a cardiac MRI.  Pt has many questions and concerns.  Attempted to address these.  Reassurance provided to Pt.  Advised would attempt to expedite HM results for Pt.  Will continue to follow.

## 2021-03-06 NOTE — Telephone Encounter (Signed)
Per pt re tracking of the monitor .[place] at the [place] Will forward to [place] whom is expediting these results ./cy

## 2021-03-06 NOTE — Telephone Encounter (Signed)
Pt is calling to let our office know that the Heart monitor was received back at heart monitor office. Pt would like for someone to call her back with the results today.

## 2021-03-09 ENCOUNTER — Telehealth: Payer: Self-pay | Admitting: Internal Medicine

## 2021-03-09 DIAGNOSIS — I459 Conduction disorder, unspecified: Secondary | ICD-10-CM

## 2021-03-09 NOTE — Telephone Encounter (Signed)
Lets get her in to be seen

## 2021-03-09 NOTE — Telephone Encounter (Signed)
[place] Scrape [phone]   Called to report that the pt had a 3 day Zio 7/16-7/19/22. She had CHB with back to back pauses up to 8.7 sec. She was symptomatic mostly at 03/02/21 at 1:45 am. I will forward to Dr. Candi Leash for review.   Last OV 02/23/21 with known CHB.

## 2021-03-09 NOTE — Telephone Encounter (Signed)
Janet Mclaughlin with [employer] is calling to report critical Zio monitor results.

## 2021-03-10 ENCOUNTER — Other Ambulatory Visit (HOSPITAL_COMMUNITY): Payer: PRIVATE HEALTH INSURANCE

## 2021-03-10 NOTE — Telephone Encounter (Signed)
Pt called and advised HM results received.  Requested call back.    Per Dr. Ivy Lynn cardiac MRI.  Order placed for MRI.  Mychart message also sent advising Pt of above.

## 2021-03-11 NOTE — Telephone Encounter (Signed)
Call placed to pt.  She has been made aware that her MRI is scheduled for 03/17/2021, her follow up with Dr. Ladona Ridgel 03/25/2021.  Pt has been advised that if she needs a PPM, Boneta Lucks, RN, has a slot on hold for her 04/10/2021.  Pt began to let me know that she had some shortness of breath, back pain, and palpitations last night. She also has noticed a lot more lower heart rates on her apple watch, every before.  Pt is extremely anxious. I talked to pt, advised her to stay calm, as I know it's easier for me to say than for her to do, but that Dr. Ladona Ridgel will need the MRI before making any final decisions, but I would relay the message to him and his RN Boneta Lucks.  Pt asked about going to the beach, pt was advised not to go.  Pt was made aware that if her symptoms got worse, she could go to the ED, but we will get her MRI and if Dr. Ladona Ridgel thought she needed to be seen sooner, he would let his RN know and someone will call her.  Pt verbalized understanding and thanked me for the call.

## 2021-03-13 ENCOUNTER — Telehealth (HOSPITAL_COMMUNITY): Payer: Self-pay | Admitting: Emergency Medicine

## 2021-03-13 NOTE — Telephone Encounter (Signed)
Reaching out to patient to offer assistance regarding upcoming cardiac imaging study; pt verbalizes understanding of appt date/time, parking situation and where to check in, pre-test NPO status and medications ordered, and verified current allergies; name and call back number provided for further questions should they arise Rockwell Alexandria RN Navigator Cardiac Imaging [facility] [phone] office [phone] cell  Denies iv issues Some claustro Denies implants

## 2021-03-17 ENCOUNTER — Other Ambulatory Visit: Payer: Self-pay

## 2021-03-17 ENCOUNTER — Ambulatory Visit (HOSPITAL_COMMUNITY)
Admission: RE | Admit: 2021-03-17 | Discharge: 2021-03-17 | Disposition: A | Discharge status: Home / Self Care | Payer: PRIVATE HEALTH INSURANCE | Source: Ambulatory Visit | Attending: Internal Medicine | Admitting: Internal Medicine

## 2021-03-17 DIAGNOSIS — I459 Conduction disorder, unspecified: Secondary | ICD-10-CM | POA: Insufficient documentation

## 2021-03-17 IMAGING — MR MR CARD MORPHOLOGY WO/W CM
45 of 48 series · 45 of 48 positions shown · IV contrast (Gadavist)
Comparison: none

CLINICAL DATA: Left bundle branch block (LBBB), new Right bundle
branch block (RBBB), new complete heart block, rule out infiltrate

EXAM:
CARDIAC MRI
TECHNIQUE: The patient was scanned on a 1.5 Tesla GE magnet. A dedicated
cardiac coil was used. Functional imaging was done using Fiesta
sequences. [DATE], and 4 chamber views were done to assess for RWMA's.
Modified POLIN rule using a short axis stack was used to
calculate an ejection fraction on a dedicated work station using
Circ[employer] software. The patient received 10mL GADAVIST GADOBUTROL 1
MMOL/ML IV SOLN. After 10 minutes inversion recovery sequences were
used to assess for infiltration and scar tissue.

[Series 5: t2_haste_db_tra_bh · axial · 8.0mm · 1.41mm/px · 1 of 16 slices shown]
[im 1/16]
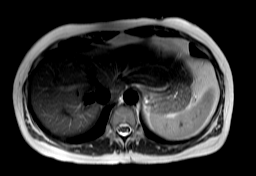

[Series 9: bSSFP · oblique · 8.0mm · 1.61mm/px · 1 of 25 slices shown (1 of 21)]
[im 1/25]
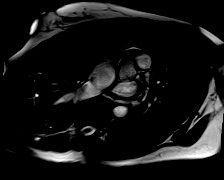

[Series 10: bSSFP · oblique · 8.0mm · 1.61mm/px · 1 of 25 slices shown (2 of 21)]
[im 1/25]
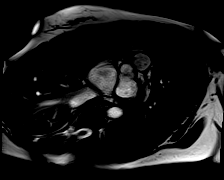

[Series 11: bSSFP · oblique · 8.0mm · 1.61mm/px · 1 of 25 slices shown (3 of 21)]
[im 1/25]
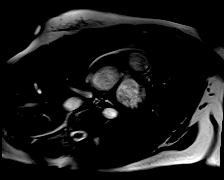

[Series 12: bSSFP · oblique · 8.0mm · 1.61mm/px · 1 of 25 slices shown (4 of 21)]
[im 1/25]
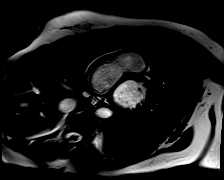

[Series 13: bSSFP · oblique · 8.0mm · 1.61mm/px · 1 of 25 slices shown (5 of 21)]
[im 1/25]
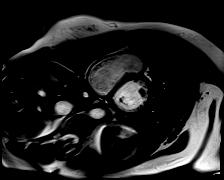

[Series 14: bSSFP · oblique · 8.0mm · 1.61mm/px · 1 of 25 slices shown (6 of 21)]
[im 1/25]
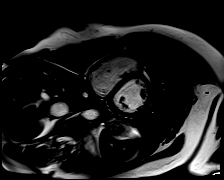

[Series 15: bSSFP · oblique · 8.0mm · 1.61mm/px · 1 of 25 slices shown (7 of 21)]
[im 1/25]
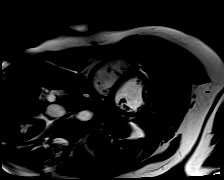

[Series 16: bSSFP · oblique · 8.0mm · 1.61mm/px · 1 of 25 slices shown (8 of 21)]
[im 1/25]
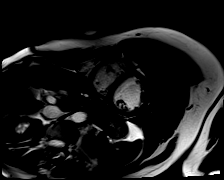

[Series 17: bSSFP · oblique · 8.0mm · 1.61mm/px · 1 of 25 slices shown (9 of 21)]
[im 1/25]
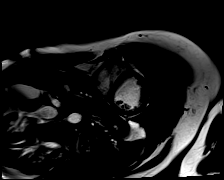

[Series 18: bSSFP · oblique · 8.0mm · 1.61mm/px · 1 of 25 slices shown (10 of 21)]
[im 1/25]
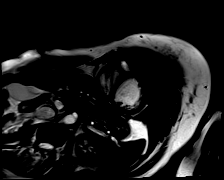

[Series 19: bSSFP · oblique · 8.0mm · 1.61mm/px · 1 of 25 slices shown (11 of 21)]
[im 1/25]
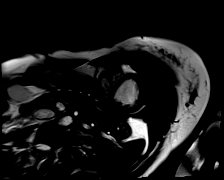

[Series 20: bSSFP · oblique · 8.0mm · 1.61mm/px · 1 of 25 slices shown (12 of 21)]
[im 1/25]
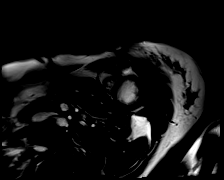

[Series 21: bSSFP · oblique · 8.0mm · 1.61mm/px · 1 of 25 slices shown (13 of 21)]
[im 1/25]
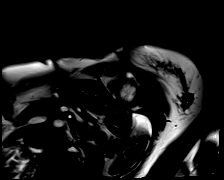

[Series 22: bSSFP · oblique · 8.0mm · 1.61mm/px · 1 of 25 slices shown (14 of 21)]
[im 1/25]
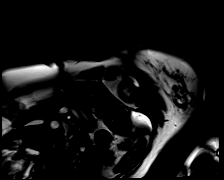

[Series 23: bSSFP · oblique · 8.0mm · 1.61mm/px · 1 of 25 slices shown (15 of 21)]
[im 1/25]
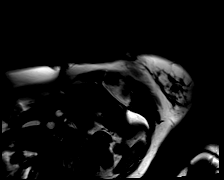

[Series 24: bSSFP · oblique · 8.0mm · 1.61mm/px · 1 of 25 slices shown (16 of 21)]
[im 1/25]
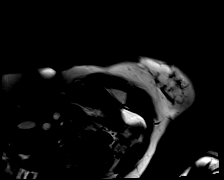

[Series 25: bSSFP · oblique · 8.0mm · 1.61mm/px · 1 of 25 slices shown (17 of 21)]
[im 1/25]
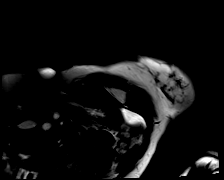

[Series 26: (id)_long_t1 · oblique · 8.0mm · 1.56mm/px · 1 of 24 slices shown]
[im 1/24]
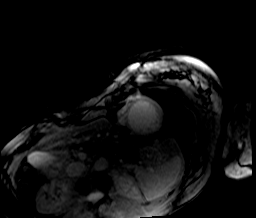

[Series 27: (id)_long_t1_moco · oblique · 8.0mm · 1.56mm/px · 1 of 24 slices shown]
[im 1/24]
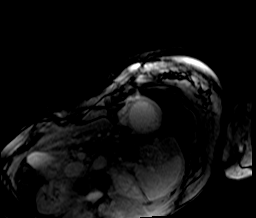

[Series 28: (id)_long_t1_moco_t1 · oblique · 8.0mm · 1.56mm/px · 1 of 3 slices shown (1 of 2)]
[im 1/3]
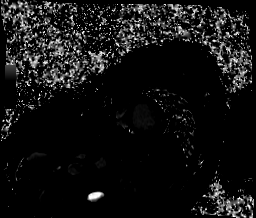

[Series 28: (id)_long_t1_moco_t1 · 1 of 3 slices shown (2 of 2)]
[im 1/3]
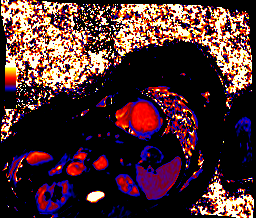

[Series 30: (id)_trufi · oblique · 8.0mm · 2.08mm/px · 1 of 9 slices shown]
[im 1/9]
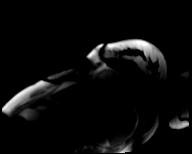

[Series 31: (id)_trufi_moco · oblique · 8.0mm · 2.08mm/px · 1 of 9 slices shown]
[im 1/9]
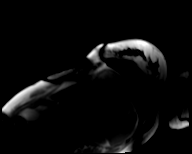

[Series 32: (id)_trufi_moco_t2 · oblique · 8.0mm · 2.08mm/px · 1 of 3 slices shown]
[im 1/3]
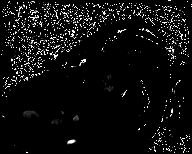

[Series 34: bSSFP · oblique · 6.0mm · 1.61mm/px · 1 of 25 slices shown (18 of 21)]
[im 1/25]
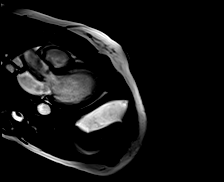

[Series 35: bSSFP · oblique · 6.0mm · 1.61mm/px · 1 of 25 slices shown (19 of 21)]
[im 1/25]
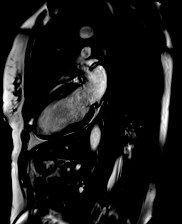

[Series 36: bSSFP · oblique · 6.0mm · 1.61mm/px · 1 of 25 slices shown (20 of 21)]
[im 1/25]
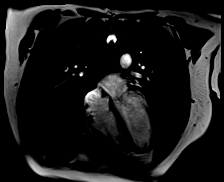

[Series 37: pre short axis · oblique · non-contrast · 8.0mm · 2.25mm/px · 1 of 10 slices shown (1 of 6)]
[im 1/10]
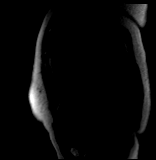

[Series 38: pre short axis · oblique · non-contrast · 8.0mm · 2.25mm/px · 1 of 10 slices shown (2 of 6)]
[im 1/10]
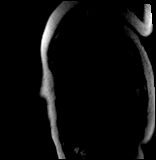

[Series 39: pre short axis · oblique · non-contrast · 8.0mm · 2.25mm/px · 1 of 10 slices shown (3 of 6)]
[im 1/10]
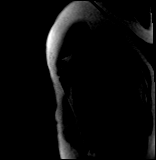

[Series 40: pre short axis · oblique · non-contrast · 8.0mm · 2.25mm/px · 1 of 10 slices shown (4 of 6)]
[im 1/10]
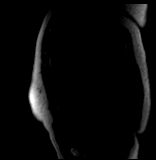

[Series 41: pre short axis · oblique · non-contrast · 8.0mm · 2.25mm/px · 1 of 10 slices shown (5 of 6)]
[im 1/10]
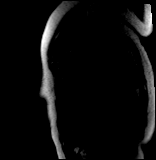

[Series 42: pre short axis · oblique · non-contrast · 8.0mm · 2.25mm/px · 1 of 10 slices shown (6 of 6)]
[im 1/10]
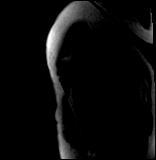

[Series 43: rest short axis · oblique · 8.0mm · 2.25mm/px · 1 of 60 slices shown (1 of 6)]
[im 1/60]
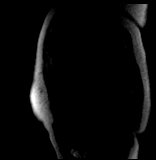

[Series 44: rest short axis · oblique · 8.0mm · 2.25mm/px · 1 of 60 slices shown (2 of 6)]
[im 1/60]
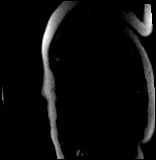

[Series 45: rest short axis · oblique · 8.0mm · 2.25mm/px · 1 of 60 slices shown (3 of 6)]
[im 1/60]
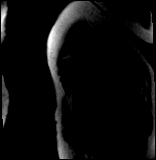

[Series 46: rest short axis · oblique · 8.0mm · 2.25mm/px · 1 of 60 slices shown (4 of 6)]
[im 1/60]
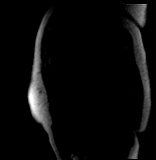

[Series 47: rest short axis · oblique · 8.0mm · 2.25mm/px · 1 of 60 slices shown (5 of 6)]
[im 1/60]
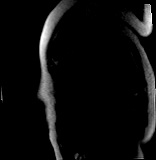

[Series 48: rest short axis · oblique · 8.0mm · 2.25mm/px · 1 of 60 slices shown (6 of 6)]
[im 1/60]
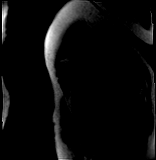

[Series 49: bSSFP · coronal · 6.0mm · 1.61mm/px · 1 of 25 slices shown (21 of 21)]
[im 1/25]
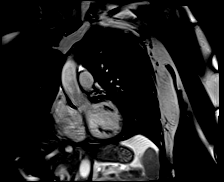

[Series 50: aortic valve cine · oblique · 6.0mm · 1.61mm/px · 1 of 25 slices shown]
[im 1/25]
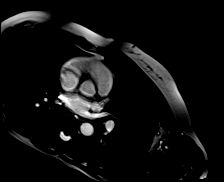

[Series 51: cine rvit · oblique · 6.0mm · 1.61mm/px · 1 of 25 slices shown]
[im 1/25]
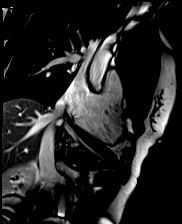

[Series 52: cine rvot · sagittal · 6.0mm · 1.61mm/px · 1 of 25 slices shown]
[im 1/25]
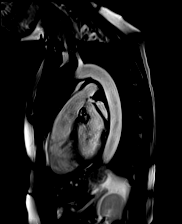

[Series 54: lge_single shot sa · oblique · 8.0mm · 2.08mm/px · 1 of 18 slices shown]
[im 1/18]
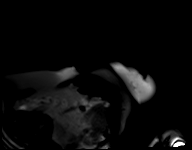

[45 of 48 positions shown; findings below may reference images not displayed]

This examination is tailored for evaluation cardiac anatomy and
function and provides very limited assessment of noncardiac
structures, which are accordingly not evaluated during
interpretation. If there is clinical concern for extracardiac
pathology, further evaluation with CT imaging should be considered.
FINDINGS: LEFT VENTRICLE:

Mild left ventricular enlargement by indexed volume.

Normal left ventricular wall thickness.

Normal left ventricular systolic function.

LVEF = 60%

There are no regional wall motion abnormalities.

No myocardial edema, T2 = 47 msec

Normal first pass perfusion.

There is no post contrast delayed myocardial enhancement.

Normal T1 myocardial nulling kinetics suggest against a diagnosis of
cardiac amyloidosis.

ECV = 24%, normal range.

RIGHT VENTRICLE:

Mild right ventricular enlargement by indexed volume.

Normal right ventricular wall thickness.

Normal right ventricular systolic function.

RVEF = 49%

There are no regional wall motion abnormalities.

No post contrast delayed myocardial enhancement.

ATRIA:

Normal left atrial size.

Normal right atrial size.

VALVES:

No significant valvular abnormalities.

PERICARDIUM:

Normal pericardium.  No pericardial effusion.

OTHER: No significant extracardiac findings.

MEASUREMENTS:
Left ventricle:

LV Female

LV EF: 60% (Normal 56-78%)

Absolute volumes:

LV EDV: 181mL (Normal 52-141 mL)

LV ESV: 73mL (Normal 13-51 mL)

LV SV: 108mL (Normal 33-97 mL)

CO: 3.9L/min (Normal 2.7-6.0 L/min)

Indexed volumes:

LV EDV: 100mL/sq-m (normal 41-81 mL/sq-m)

LV ESV: 40mL/sq-m (normal 12-21 mL/sq-m)

LV SV: 60mL/sq-m (Normal 26-56 mL/sq-m)

CI: 2.16L/min/sq-m (Normal 1.8-3.8 L/min/sq-m)

Right ventricle:

RV female

RV EF: 49% (normal 47-80%)

Absolute volumes:

RV EDV: 169 mL (Normal 58-154 mL)

RV ESV: 86 mL (Normal 12-68 mL)

RV SV: 83 mL (Normal 35-98 mL)

CO: 3.0 L/min (Normal 2.7-6 L/min)

Indexed volumes:

RV EDV: 94 ML/sq-m (Normal 48-87 mL/sq-m)

RV ESV: 48 mL/sq-m (Normal 11-28 mL/sq-m)

RV SV: 46 mL/sq-m (Normal 27-57 mL/sq-m)

CI: 1.66 L/min/sq-m (Normal 1.8-3.8 L/min/sq-m)
IMPRESSION: 1. Mild left ventricular enlargement by indexed volume, with normal
left ventricular systolic function. LVEF 60%.

2. Mild right ventricular enlargement by indexed volume with normal
right ventricular systolic function. RVEF 49%.

3. No myocardial edema or post contrast delayed myocardial
enhancement. ECV 24%. No findings to suggest scar, fibrosis,
inflammation, infarction, or infiltrative process.

## 2021-03-17 MED ORDER — GADOBUTROL 1 MMOL/ML IV SOLN
10.0000 mL | Freq: Once | INTRAVENOUS | Status: AC | PRN
  Administered 2021-03-17: 10 mL via INTRAVENOUS

## 2021-03-23 ENCOUNTER — Telehealth: Payer: Self-pay

## 2021-03-23 NOTE — Telephone Encounter (Signed)
Call placed to Pt.  Advised that at her appt with Dr. Ladona Ridgel on 8/10 he would be discussing placing a pacemaker ASAP.  Advised 8/11 is available for pacemaker implant.  Pt states she is at a funeral and needs to call back.

## 2021-03-24 NOTE — Telephone Encounter (Signed)
Call back received from Janet Mclaughlin.  Advised that at her appt on 03/25/21 with Dr. Ladona Ridgel he is going to advise Janet Mclaughlin move forward with pacemaker implant with some haste.  Advised that there was an opening for her procedure on 03/26/21 at 11:30 am, and would hold that spot for Janet Mclaughlin.  All Janet Mclaughlin's questions answered.

## 2021-03-25 ENCOUNTER — Encounter: Payer: Self-pay | Admitting: Internal Medicine

## 2021-03-25 ENCOUNTER — Other Ambulatory Visit: Payer: Self-pay

## 2021-03-25 ENCOUNTER — Ambulatory Visit (INDEPENDENT_AMBULATORY_CARE_PROVIDER_SITE_OTHER): Payer: PRIVATE HEALTH INSURANCE | Admitting: Internal Medicine

## 2021-03-25 VITALS — BP 108/64 | HR 38 | Ht 66.0 in | Wt 154.0 lb

## 2021-03-25 DIAGNOSIS — I442 Atrioventricular block, complete: Secondary | ICD-10-CM | POA: Diagnosis not present

## 2021-03-25 NOTE — Progress Notes (Signed)
      HPI Janet Mclaughlin returns today for followup of [facility]. She has had progressively worsening of her AV conduction with her Apple watch demonstrating progressive worsening of her AV conduction. She has not had syncope and denies chest pain or sob.  No Known Allergies   Current Outpatient Medications  Medication Sig Dispense Refill   Multiple Vitamin (MULTIVITAMIN) tablet Take 2 tablets by mouth daily. Gummie     No current facility-administered medications for this visit.     Past Medical History:  Diagnosis Date   Body mass index (BMI) 25.0-25.9, adult    Bradycardia    Heart murmur    Overweight     ROS:   All systems reviewed and negative except as noted in the HPI.   Past Surgical History:  Procedure Laterality Date   NO PAST SURGERIES       Family History  Problem Relation Age of Onset   Breast cancer Maternal Grandmother    Hypertension Mother    Diabetes Father    Polycythemia Brother    Heart Problems Son        VENTRICULAR SEPTAL DEFECT   Cancer Maternal Grandfather        BILE DUCT     Social History   Socioeconomic History   Marital status: Married    Spouse name: Not on file   Number of children: Not on file   Years of education: Not on file   Highest education level: Not on file  Occupational History   Not on file  Tobacco Use   Smoking status: Never   Smokeless tobacco: Never  Substance and Sexual Activity   Alcohol use: Yes    Comment: 3-4 drinks/ week   Drug use: No   Sexual activity: Yes    Partners: Male  Other Topics Concern   Not on file  Social History Narrative   Not on file   Social Determinants of Health   Financial Resource Strain: Not on file  Food Insecurity: Not on file  Transportation Needs: Not on file  Physical Activity: Not on file  Stress: Not on file  Social Connections: Not on file  Intimate Partner Violence: Not on file     BP 108/64   Pulse (!) 38   Ht 5' 6" (1.676 m)   Wt 154 lb (69.9 kg)    SpO2 98%   BMI 24.86 kg/m   Physical Exam:  Well appearing NAD HEENT: Unremarkable Neck:  No JVD, no thyromegally Lymphatics:  No adenopathy Back:  No CVA tenderness Lungs:  Clear with no wheezes HEART:  Regular rate rhythm, no murmurs, no rubs, no clicks Abd:  soft, positive bowel sounds, no organomegally, no rebound, no guarding Ext:  2 plus pulses, no edema, no cyanosis, no clubbing Skin:  No rashes no nodules Neuro:  CN II through XII intact, motor grossly intact  EKG - reviewed. NSR with [facility]  Assess/Plan:  [facility] - I have discussed the treatment options with the patient and recommend proceeding with PPM insertion. She has not been found to have any reversible causes.   Janet Bianchini,MD 

## 2021-03-25 NOTE — Patient Instructions (Addendum)
Medication Instructions:  Your physician recommends that you continue on your current medications as directed. Please refer to the Current Medication list given to you today.  Labwork: You will get lab work today:  CBC and BMP  Testing/Procedures: Your physician has recommended that you have a pacemaker inserted. A pacemaker is a small device that is placed under the skin of your chest or abdomen to help control abnormal heart rhythms. This device uses electrical pulses to prompt the heart to beat at a normal rate. Pacemakers are used to treat heart rhythms that are too slow. Wire (leads) are attached to the pacemaker that goes into the chambers of you heart.   Follow-Up:  SEE INSTRUCTION LETTER  Any Other Special Instructions Will Be Listed Below (If Applicable).  If you need a refill on your cardiac medications before your next appointment, please call your pharmacy.   Pacemaker Implantation, Adult Pacemaker implantation is a procedure to place a pacemaker inside the chest. A pacemaker is a small computer that sends electrical signals to the heart and helps the heart beat normally. A pacemaker also stores information about heart rhythms. You may need pacemaker implantation if you have: A slow heartbeat (bradycardia). Loss of consciousness that happens repeatedly (syncope) or repeated episodes of dizziness or light-headedness because of an irregular heart rate. Shortness of breath (dyspnea) due to heart problems. The pacemaker usually attaches to your heart through a wire called a lead. One or two leads may be needed. There are different types of pacemakers: Transvenous pacemaker. This type is placed under the skin or muscle of your upper chest area. The lead goes through a vein in the chest area to reach the inside of the heart. Epicardial pacemaker. This type is placed under the skin or muscle of your chest or abdomen. The lead goes through your chest to the outside of the heart. Tell a  health care provider about: Any allergies you have. All medicines you are taking, including vitamins, herbs, eye drops, creams, and over-the-counter medicines. Any problems you or family members have had with anesthetic medicines. Any blood or bone disorders you have. Any surgeries you have had. Any medical conditions you have. Whether you are pregnant or may be pregnant. What are the risks? Generally, this is a safe procedure. However, problems may occur, including: Infection. Bleeding. Failure of the pacemaker or the lead. Collapse of a lung or bleeding into a lung. Blood clot inside a blood vessel with a lead. Damage to the heart. Infection inside the heart (endocarditis). Allergic reactions to medicines. What happens before the procedure? Staying hydrated Follow instructions from your health care provider about hydration, which may include: Up to 2 hours before the procedure - you may continue to drink clear liquids, such as water, clear fruit juice, black coffee, and plain tea.  Eating and drinking restrictions Follow instructions from your health care provider about eating and drinking, which may include: 8 hours before the procedure - stop eating heavy meals or foods, such as meat, fried foods, or fatty foods. 6 hours before the procedure - stop eating light meals or foods, such as toast or cereal. 6 hours before the procedure - stop drinking milk or drinks that contain milk. 2 hours before the procedure - stop drinking clear liquids. Medicines Ask your health care provider about: Changing or stopping your regular medicines. This is especially important if you are taking diabetes medicines or blood thinners. Taking medicines such as aspirin and ibuprofen. These medicines can thin your  blood. Do not take these medicines unless your health care provider tells you to take them. Taking over-the-counter medicines, vitamins, herbs, and supplements. Tests You may have: A heart  evaluation. This may include: An electrocardiogram (ECG). This involves placing patches on your skin to check your heart rhythm. A chest X-ray. An echocardiogram. This is a test that uses sound waves (ultrasound) to produce an image of the heart. A cardiac rhythm monitor. This is used to record your heart rhythm and any events for a longer period of time. Blood tests. Genetic testing. General instructions Do not use any products that contain nicotine or tobacco for at least 4 weeks before the procedure. These products include cigarettes, e-cigarettes, and chewing tobacco. If you need help quitting, ask your health care provider. Ask your health care provider: How your surgery site will be marked. What steps will be taken to help prevent infection. These steps may include: Removing hair at the surgery site. Washing skin with a germ-killing soap. Receiving antibiotic medicine. Plan to have someone take you home from the hospital or clinic. If you will be going home right after the procedure, plan to have someone with you for 24 hours. What happens during the procedure? An IV will be inserted into one of your veins. You will be given one or more of the following: A medicine to help you relax (sedative). A medicine to numb the area (local anesthetic). A medicine to make you fall asleep (general anesthetic). The next steps vary depending on the type of pacemaker you will be getting. If you are getting a transvenous pacemaker: An incision will be made in your upper chest. A pocket will be made for the pacemaker. It may be placed under the skin or between layers of muscle. The lead will be inserted into a blood vessel that goes to the heart. While X-rays are taken by an imaging machine (fluoroscopy), the lead will be advanced through the vein to the inside of your heart. The other end of the lead will be tunneled under the skin and attached to the pacemaker. If you are getting an epicardial  pacemaker: An incision will be made near your ribs or breastbone (sternum) for the lead. The lead will be attached to the outside of your heart. Another incision will be made in your chest or upper abdomen to create a pocket for the pacemaker. The free end of the lead will be tunneled under the skin and attached to the pacemaker. The transvenous or epicardial pacemaker will be tested. Imaging studies may be done to check the lead position. The incisions will be closed with stitches (sutures), adhesive strips, or skin glue. Bandages (dressings) will be placed over the incisions. The procedure may vary among health care providers and hospitals. What happens after the procedure? Your blood pressure, heart rate, breathing rate, and blood oxygen level will be monitored until you leave the hospital or clinic. You may be given antibiotics. You will be given pain medicine. An ECG and chest X-rays will be done. You may need to wear a continuous type of ECG (Holter monitor) to check your heart rhythm. Your health care provider will program the pacemaker. If you were given a sedative during the procedure, it can affect you for several hours. Do not drive or operate machinery until your health care provider says that it is safe. You will be given a pacemaker identification card. This card lists the implant date, device model, and manufacturer of your pacemaker. Summary A pacemaker  is a small computer that sends electrical signals to the heart and helps the heart beat normally. There are different types of pacemakers. A pacemaker may be placed under the skin or muscle of your chest or abdomen. Follow instructions from your health care provider about eating and drinking and about taking medicines before the procedure. This information is not intended to replace advice given to you by your health care provider. Make sure you discuss any questions you have with your healthcare provider. Document Revised:  07/04/2019 Document Reviewed: 07/04/2019 Elsevier Patient Education  2022 [employer].

## 2021-03-25 NOTE — H&P (View-Only) (Signed)
      HPI [name] returns today for followup of [facility]. She has had progressively worsening of her AV conduction with her Apple watch demonstrating progressive worsening of her AV conduction. She has not had syncope and denies chest pain or sob.  No Known Allergies   Current Outpatient Medications  Medication Sig Dispense Refill   Multiple Vitamin (MULTIVITAMIN) tablet Take 2 tablets by mouth daily. Gummie     No current facility-administered medications for this visit.     Past Medical History:  Diagnosis Date   Body mass index (BMI) 25.0-25.9, adult    Bradycardia    Heart murmur    Overweight     ROS:   All systems reviewed and negative except as noted in the HPI.   Past Surgical History:  Procedure Laterality Date   NO PAST SURGERIES       Family History  Problem Relation Age of Onset   Breast cancer Maternal Grandmother    Hypertension Mother    Diabetes Father    Polycythemia Brother    Heart Problems Son        VENTRICULAR SEPTAL DEFECT   Cancer Maternal Grandfather        BILE DUCT     Social History   Socioeconomic History   Marital status: Married    Spouse name: Not on file   Number of children: Not on file   Years of education: Not on file   Highest education level: Not on file  Occupational History   Not on file  Tobacco Use   Smoking status: Never   Smokeless tobacco: Never  Substance and Sexual Activity   Alcohol use: Yes    Comment: 3-4 drinks/ week   Drug use: No   Sexual activity: Yes    Partners: Male  Other Topics Concern   Not on file  Social History Narrative   Not on file   Social Determinants of Health   Financial Resource Strain: Not on file  Food Insecurity: Not on file  Transportation Needs: Not on file  Physical Activity: Not on file  Stress: Not on file  Social Connections: Not on file  Intimate Partner Violence: Not on file     BP 108/64   Pulse (!) 38   Ht 5\' 6"  (1.676 m)   Wt 154 lb (69.9 kg)    SpO2 98%   BMI 24.86 kg/m   Physical Exam:  Well appearing NAD HEENT: Unremarkable Neck:  No JVD, no thyromegally Lymphatics:  No adenopathy Back:  No CVA tenderness Lungs:  Clear with no wheezes HEART:  Regular rate rhythm, no murmurs, no rubs, no clicks Abd:  soft, positive bowel sounds, no organomegally, no rebound, no guarding Ext:  2 plus pulses, no edema, no cyanosis, no clubbing Skin:  No rashes no nodules Neuro:  CN II through XII intact, motor grossly intact  EKG - reviewed. NSR with [facility]  Assess/Plan:  [facility] - I have discussed the treatment options with the patient and recommend proceeding with PPM insertion. She has not been found to have any reversible causes.   Arshia Spellman,MD

## 2021-03-26 ENCOUNTER — Ambulatory Visit (HOSPITAL_COMMUNITY): Payer: 59

## 2021-03-26 ENCOUNTER — Ambulatory Visit (HOSPITAL_COMMUNITY)
Admission: RE | Disposition: A | Discharge status: Home / Self Care | Payer: Self-pay | Source: Home / Self Care | Attending: Internal Medicine

## 2021-03-26 ENCOUNTER — Other Ambulatory Visit: Payer: Self-pay

## 2021-03-26 ENCOUNTER — Ambulatory Visit (HOSPITAL_COMMUNITY)
Admission: RE | Admit: 2021-03-26 | Discharge: 2021-03-26 | Disposition: A | Discharge status: Home / Self Care | Payer: 59 | Attending: Internal Medicine | Admitting: Internal Medicine

## 2021-03-26 DIAGNOSIS — Z950 Presence of cardiac pacemaker: Secondary | ICD-10-CM

## 2021-03-26 DIAGNOSIS — I442 Atrioventricular block, complete: Secondary | ICD-10-CM

## 2021-03-26 DIAGNOSIS — R001 Bradycardia, unspecified: Secondary | ICD-10-CM | POA: Diagnosis not present

## 2021-03-26 HISTORY — PX: PACEMAKER IMPLANT: EP1218

## 2021-03-26 LAB — CBC WITH DIFFERENTIAL/PLATELET
Basophils Absolute: 0.1 10*3/uL (ref 0.0–0.2)
Basos: 1 %
EOS (ABSOLUTE): 0 10*3/uL (ref 0.0–0.4)
Eos: 1 %
Hematocrit: 42.1 % (ref 34.0–46.6)
Hemoglobin: 14.2 g/dL (ref 11.1–15.9)
Immature Grans (Abs): 0 10*3/uL (ref 0.0–0.1)
Immature Granulocytes: 0 %
Lymphocytes Absolute: 1.3 10*3/uL (ref 0.7–3.1)
Lymphs: 23 %
MCH: 30.5 pg (ref 26.6–33.0)
MCHC: 33.7 g/dL (ref 31.5–35.7)
MCV: 90 fL (ref 79–97)
Monocytes Absolute: 0.4 10*3/uL (ref 0.1–0.9)
Monocytes: 7 %
Neutrophils Absolute: 4 10*3/uL (ref 1.4–7.0)
Neutrophils: 68 %
Platelets: 296 10*3/uL (ref 150–450)
RBC: 4.66 x10E6/uL (ref 3.77–5.28)
RDW: 13.1 % (ref 11.7–15.4)
WBC: 5.8 10*3/uL (ref 3.4–10.8)

## 2021-03-26 LAB — BASIC METABOLIC PANEL
BUN/Creatinine Ratio: 15 (ref 9–23)
BUN: 13 mg/dL (ref 6–24)
CO2: 24 mmol/L (ref 20–29)
Calcium: 10 mg/dL (ref 8.7–10.2)
Chloride: 104 mmol/L (ref 96–106)
Creatinine, Ser: 0.88 mg/dL (ref 0.57–1.00)
Glucose: 91 mg/dL (ref 65–99)
Potassium: 4.4 mmol/L (ref 3.5–5.2)
Sodium: 141 mmol/L (ref 134–144)
eGFR: 76 mL/min/{1.73_m2} (ref >59)

## 2021-03-26 IMAGING — CR DG CHEST 2V
2 series · 2 of 2 positions shown · non-contrast
Comparison: None.

CLINICAL DATA: Pacemaker placement.

EXAM:
CHEST - 2 VIEW

[w chest pa]
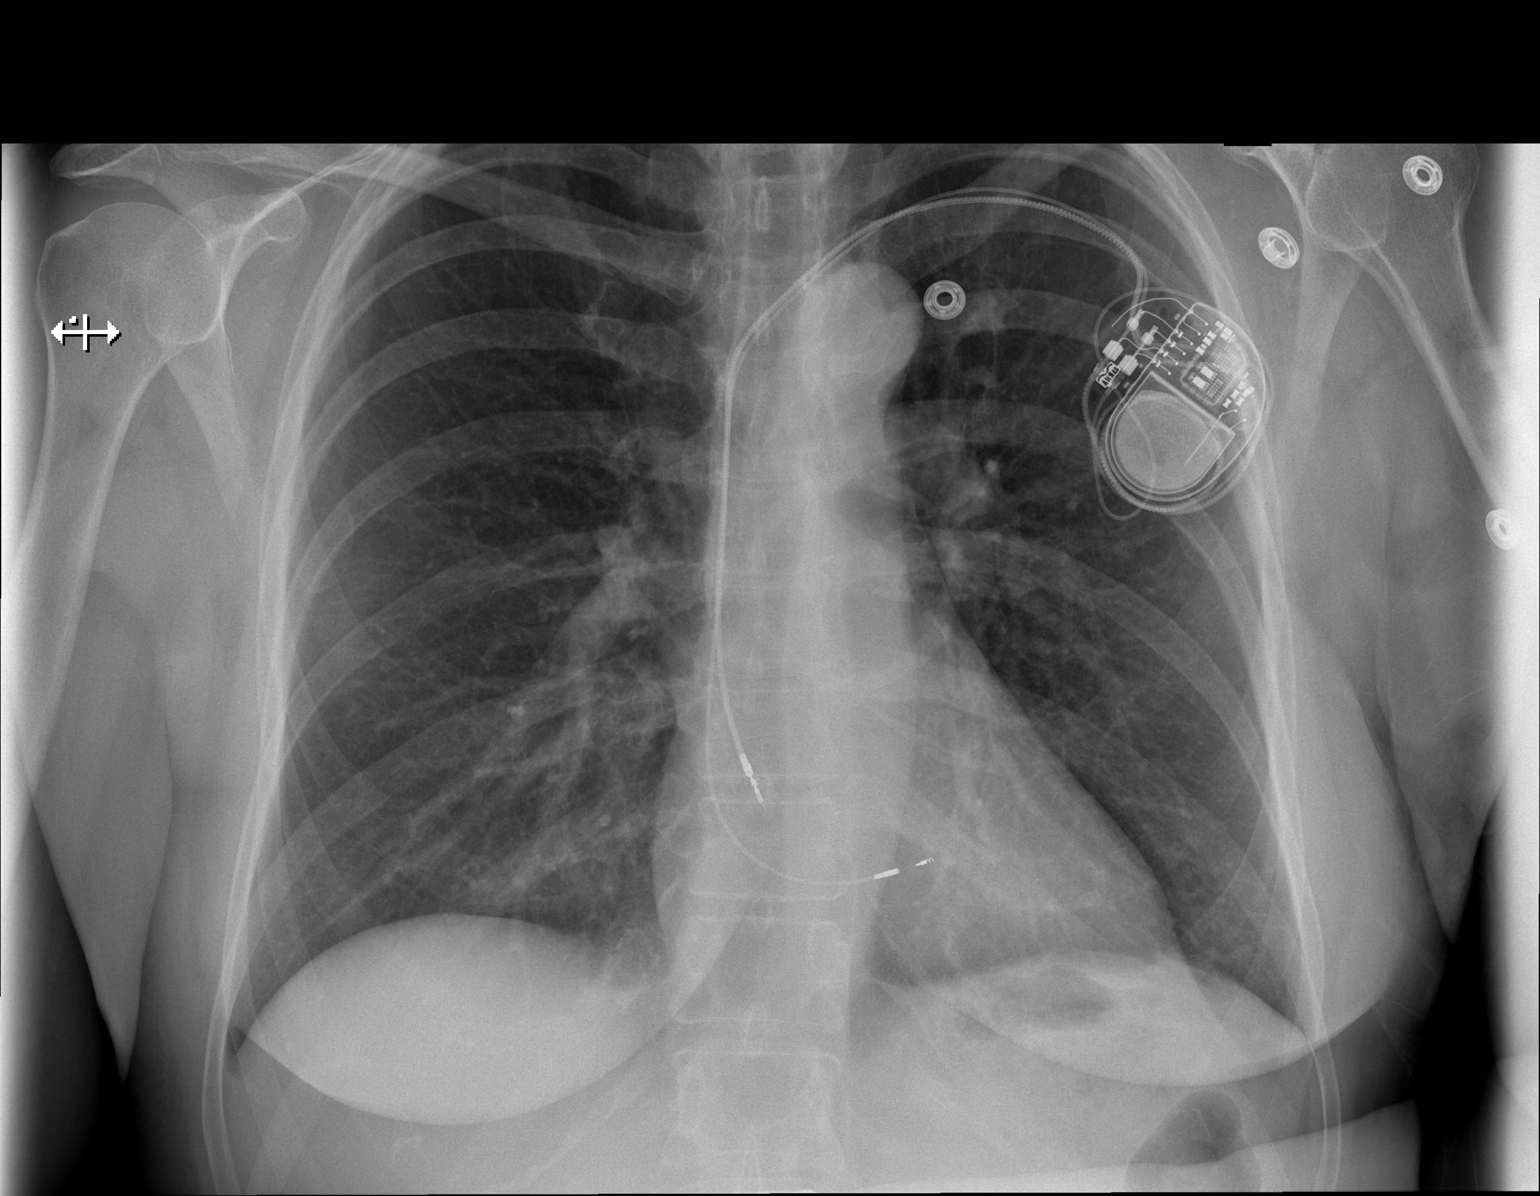

[w chest lat]
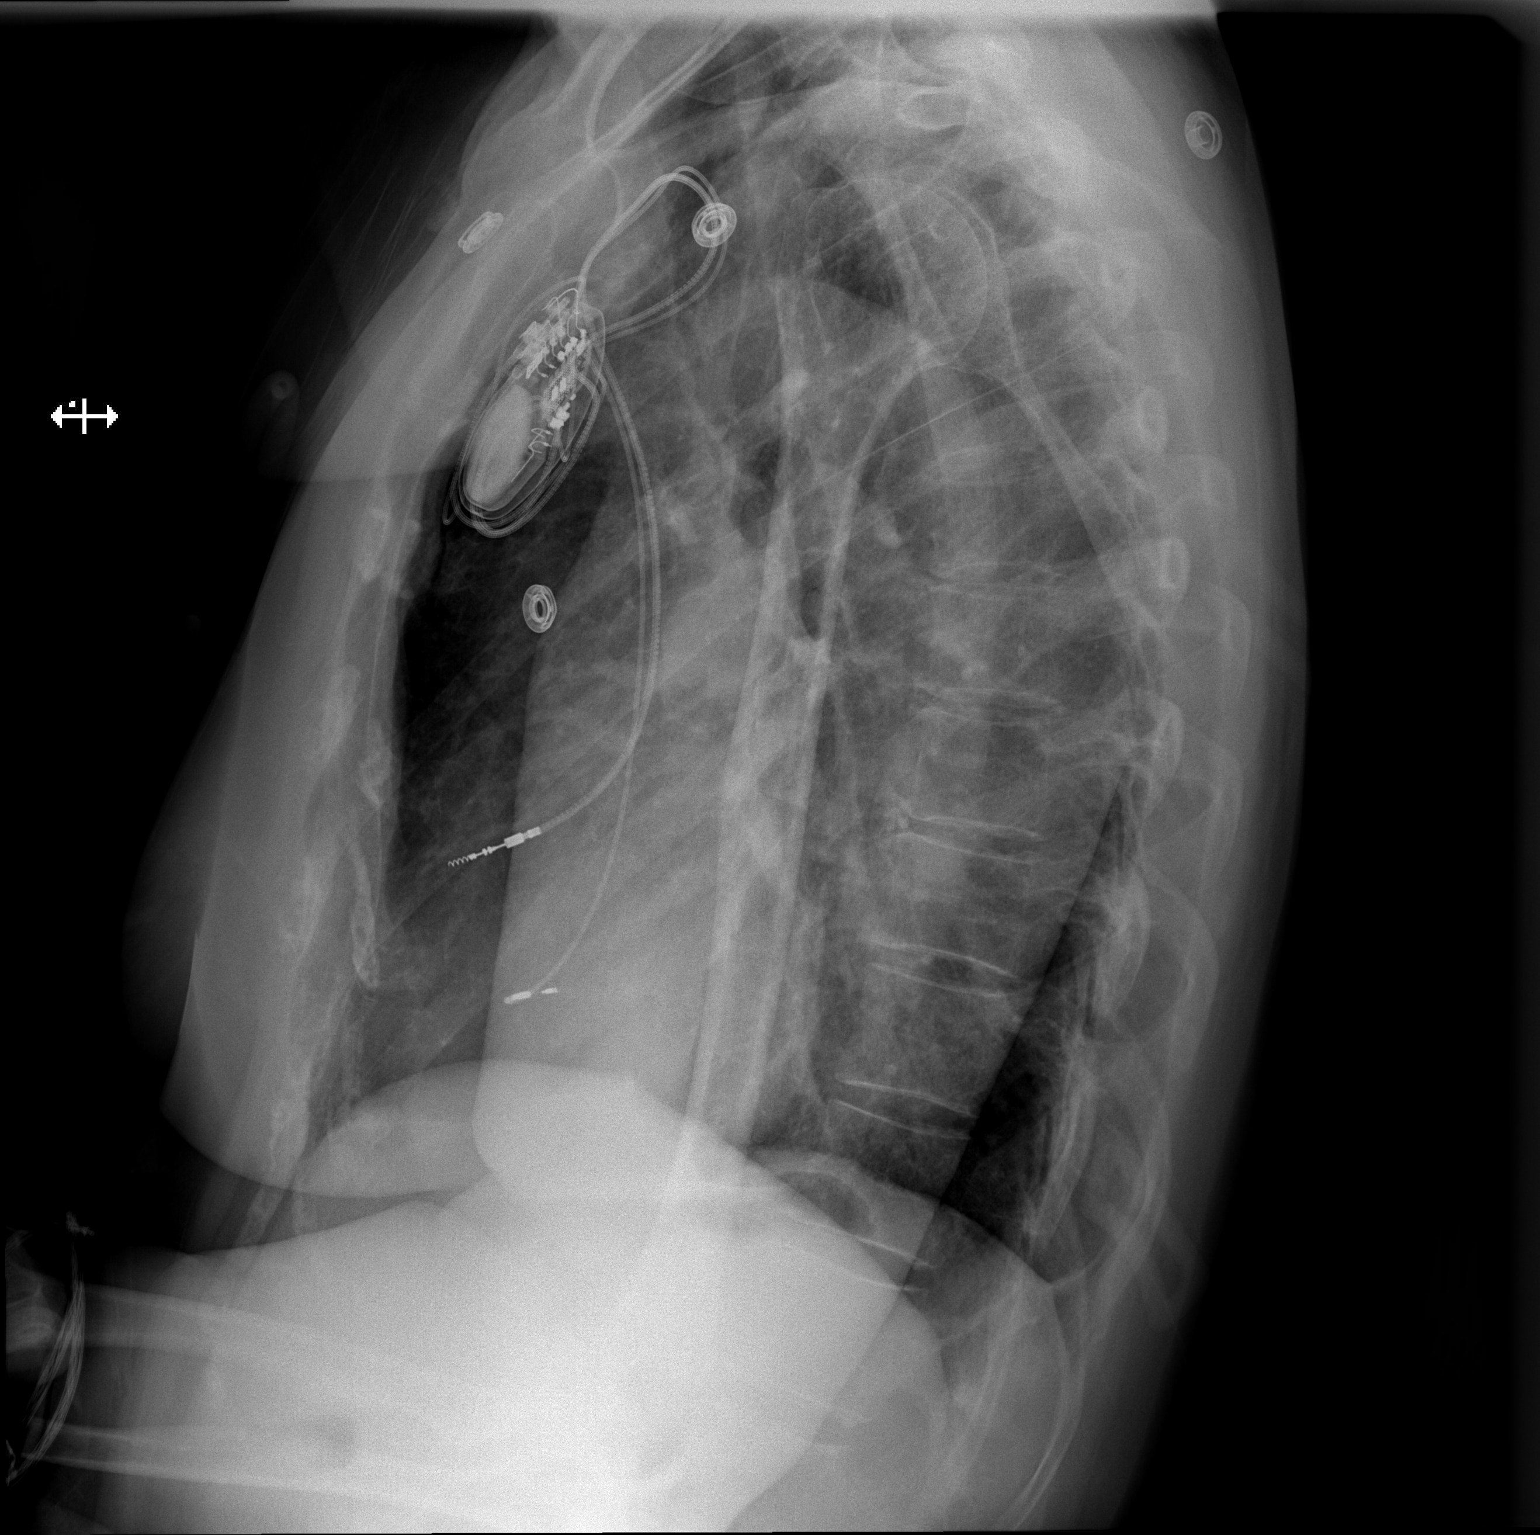

[2 of 2 positions shown; findings below may reference images not displayed]

FINDINGS: The heart size and mediastinal contours are within normal limits.
Both lungs are clear. Left-sided pacemaker is noted with leads in
grossly good position. No pneumothorax or pleural effusion is noted.
The visualized skeletal structures are unremarkable.
IMPRESSION: Interval placement of left-sided pacemaker.

## 2021-03-26 SURGERY — PACEMAKER IMPLANT

## 2021-03-26 MED ORDER — HEPARIN (PORCINE) IN NACL 1000-0.9 UT/500ML-% IV SOLN
INTRAVENOUS | Status: AC
  Filled 2021-03-26: qty 500

## 2021-03-26 MED ORDER — CEFAZOLIN SODIUM-DEXTROSE 1-4 GM/50ML-% IV SOLN
1.0000 g | Freq: Four times a day (QID) | INTRAVENOUS | Status: DC
Start: 2021-03-26 — End: 2021-03-27
  Administered 2021-03-26: 1 g via INTRAVENOUS
  Filled 2021-03-26 (×2): qty 50

## 2021-03-26 MED ORDER — ONDANSETRON HCL 4 MG/2ML IJ SOLN: 4.0000 mg | Freq: Four times a day (QID) | INTRAMUSCULAR | Status: DC | PRN

## 2021-03-26 MED ORDER — MIDAZOLAM HCL 5 MG/5ML IJ SOLN
INTRAMUSCULAR | Status: DC | PRN
  Administered 2021-03-26 (×2): 1 mg via INTRAVENOUS
  Administered 2021-03-26: 2 mg via INTRAVENOUS
  Administered 2021-03-26: 1 mg via INTRAVENOUS

## 2021-03-26 MED ORDER — LIDOCAINE HCL 1 % IJ SOLN
INTRAMUSCULAR | Status: AC
  Filled 2021-03-26: qty 20

## 2021-03-26 MED ORDER — CHLORHEXIDINE GLUCONATE 4 % EX LIQD: 4.0000 "application " | Freq: Once | CUTANEOUS | Status: DC

## 2021-03-26 MED ORDER — SODIUM CHLORIDE 0.9 % IV SOLN: INTRAVENOUS | Status: DC

## 2021-03-26 MED ORDER — POVIDONE-IODINE 10 % EX SWAB
2.0000 "application " | Freq: Once | CUTANEOUS | Status: AC
  Administered 2021-03-26: 2 via TOPICAL

## 2021-03-26 MED ORDER — MIDAZOLAM HCL 5 MG/5ML IJ SOLN
INTRAMUSCULAR | Status: AC
  Filled 2021-03-26: qty 5

## 2021-03-26 MED ORDER — HEPARIN (PORCINE) IN NACL 1000-0.9 UT/500ML-% IV SOLN
INTRAVENOUS | Status: DC | PRN
  Administered 2021-03-26: 500 mL

## 2021-03-26 MED ORDER — LIDOCAINE HCL (PF) 1 % IJ SOLN
INTRAMUSCULAR | Status: DC | PRN
  Administered 2021-03-26: 45 mL

## 2021-03-26 MED ORDER — SODIUM CHLORIDE 0.9 % IV SOLN
80.0000 mg | INTRAVENOUS | Status: AC
  Administered 2021-03-26: 80 mg

## 2021-03-26 MED ORDER — CEFAZOLIN SODIUM-DEXTROSE 2-4 GM/100ML-% IV SOLN
INTRAVENOUS | Status: AC
  Filled 2021-03-26: qty 100

## 2021-03-26 MED ORDER — ACETAMINOPHEN 325 MG PO TABS
325.0000 mg | ORAL_TABLET | ORAL | Status: DC | PRN
  Administered 2021-03-26: 650 mg via ORAL
  Filled 2021-03-26: qty 2

## 2021-03-26 MED ORDER — FENTANYL CITRATE (PF) 100 MCG/2ML IJ SOLN
INTRAMUSCULAR | Status: DC | PRN
  Administered 2021-03-26 (×3): 12.5 ug via INTRAVENOUS
  Administered 2021-03-26: 25 ug via INTRAVENOUS

## 2021-03-26 MED ORDER — CEFAZOLIN SODIUM-DEXTROSE 2-4 GM/100ML-% IV SOLN
2.0000 g | INTRAVENOUS | Status: AC
  Administered 2021-03-26: 2 g via INTRAVENOUS

## 2021-03-26 MED ORDER — SODIUM CHLORIDE 0.9 % IV SOLN
INTRAVENOUS | Status: AC
  Filled 2021-03-26: qty 2

## 2021-03-26 MED ORDER — ACETAMINOPHEN 325 MG PO TABS
ORAL_TABLET | ORAL | Status: AC
  Filled 2021-03-26: qty 1

## 2021-03-26 MED ORDER — FENTANYL CITRATE (PF) 100 MCG/2ML IJ SOLN
INTRAMUSCULAR | Status: AC
  Filled 2021-03-26: qty 2

## 2021-03-26 SURGICAL SUPPLY — 12 items
CABLE SURGICAL S-101-97-12 (CABLE) ×3 IMPLANT
CATH RIGHTSITE C315HIS02 (CATHETERS) ×1 IMPLANT
IPG PACE AZUR XT DR MRI W1DR01 (Pacemaker) ×1 IMPLANT
LEAD CAPSURE NOVUS 5076-52CM (Lead) ×1 IMPLANT
LEAD SELECT SECURE 3830 383069 (Lead) IMPLANT
PACE AZURE XT DR MRI W1DR01 (Pacemaker) ×2 IMPLANT
PAD PRO RADIOLUCENT 2001M-C (PAD) ×2 IMPLANT
SELECT SECURE 3830 383069 (Lead) ×2 IMPLANT
SHEATH 7FR PRELUDE SNAP 13 (SHEATH) ×2 IMPLANT
SLITTER 6232ADJ (MISCELLANEOUS) ×1 IMPLANT
TRAY PACEMAKER INSERTION (PACKS) ×2 IMPLANT
WIRE HI TORQ VERSACORE-J 145CM (WIRE) ×1 IMPLANT

## 2021-03-26 NOTE — Discharge Instructions (Addendum)
    Supplemental Discharge Instructions for  Pacemaker/Defibrillator Patients  Tomorrow, 03/27/21, send in a device transmission  Activity No heavy lifting or vigorous activity with your left/right arm for 6 to 8 weeks.  Do not raise your left/right arm above your head for one week.  Gradually raise your affected arm as drawn below.             03/31/21                    04/01/21                    04/02/21                  04/03/21 __  NO DRIVING for   1 week  ; you may begin driving on  5/37/48  .  WOUND CARE Keep the wound area clean and dry.  Do not get this area wet , no showers for one week; you may shower on   04/03/21  . Tomorrow, 03/27/21, remove the arm sling Tomorrow, 03/27/21 remove the LARGE outer plastic bandage.  Underneath the plastic bandage there are steri strips (paper tapes), DO NOT remove these. The tape/steri-strips on your wound will fall off; do not pull them off.  No bandage is needed on the site.  DO  NOT apply any creams, oils, or ointments to the wound area. If you notice any drainage or discharge from the wound, any swelling or bruising at the site, or you develop a fever > 101? F after you are discharged home, call the office at once.  Special Instructions You are still able to use cellular telephones; use the ear opposite the side where you have your pacemaker/defibrillator.  Avoid carrying your cellular phone near your device. When traveling through airports, show security personnel your identification card to avoid being screened in the metal detectors.  Ask the security personnel to use the hand wand. Avoid arc welding equipment, MRI testing (magnetic resonance imaging), TENS units (transcutaneous nerve stimulators).  Call the office for questions about other devices. Avoid electrical appliances that are in poor condition or are not properly grounded. Microwave ovens are safe to be near or to operate.

## 2021-03-26 NOTE — Interval H&P Note (Signed)
History and Physical Interval Note:  03/26/2021 12:31 PM  Janet Mclaughlin  has presented today for surgery, with the diagnosis of heart block.  The various methods of treatment have been discussed with the patient and family. After consideration of risks, benefits and other options for treatment, the patient has consented to  Procedure(s): PACEMAKER IMPLANT (N/A) as a surgical intervention.  The patient's history has been reviewed, patient examined, no change in status, stable for surgery.  I have reviewed the patient's chart and labs.  Questions were answered to the patient's satisfaction.     Lewayne Bunting

## 2021-03-26 NOTE — Progress Notes (Signed)
Notified MD of chest xray results. MD stated patient was ok to discharge.

## 2021-03-27 ENCOUNTER — Encounter (HOSPITAL_COMMUNITY): Payer: Self-pay | Admitting: Internal Medicine

## 2021-03-27 ENCOUNTER — Telehealth: Payer: Self-pay

## 2021-03-27 MED FILL — Lidocaine HCl Local Inj 1%: INTRAMUSCULAR | Qty: 45 | Status: AC

## 2021-03-27 NOTE — Telephone Encounter (Signed)
-----   Message from Sheilah Pigeon, [place] sent at 03/27/2021 12:37 PM EDT ----- Sorry ... This is a same day d/c from yesterday  PPM MDT  Ladona Ridgel

## 2021-03-27 NOTE — Telephone Encounter (Signed)
Follow-up after same day discharge: Implant date: 03/26/21 MD: Lewayne Bunting, MD Device: Medtronic [id number] Azure XT DR MRI Location: Left Chest   Wound check visit: 04/08/2021 @ 3:20 90 day MD follow-up: 07/06/21 @ 4:00  Remote Transmission received:Yes, normal remote transmission.   Dressing removed: Yes,  Wound education provided to patient.  Advised to call with any redness, swelling, warmth, fever or chills.   Direct phone number provided.

## 2021-04-03 ENCOUNTER — Telehealth: Payer: Self-pay | Admitting: Internal Medicine

## 2021-04-03 NOTE — Telephone Encounter (Signed)
   Pt is requesting to speak with a nurse, she said she recently got pacemaker and she was experiencing pain last night

## 2021-04-03 NOTE — Telephone Encounter (Signed)
Spoke with the patient who states that she had pain in the center of her chest last night. She states that it lasted about 30 minutes. She states that pain was "pulsating" and felt like a tightness. She denies any associated N/V, dizziness, SOB, or diaphoresis. I attempted to get more information from the patient but she states that she would just like for Boneta Lucks to call her back when she returns to the office. Patient was advised on ER precautions.

## 2021-04-08 ENCOUNTER — Ambulatory Visit (INDEPENDENT_AMBULATORY_CARE_PROVIDER_SITE_OTHER): Payer: PRIVATE HEALTH INSURANCE

## 2021-04-08 ENCOUNTER — Other Ambulatory Visit: Payer: Self-pay

## 2021-04-08 DIAGNOSIS — I442 Atrioventricular block, complete: Secondary | ICD-10-CM

## 2021-04-08 LAB — CUP PACEART INCLINIC DEVICE CHECK
Battery Remaining Longevity: 137 mo
Battery Voltage: 3.21 V
Brady Statistic AP VP Percent: 34.6 %
Brady Statistic AP VS Percent: 0 %
Brady Statistic AS VP Percent: 65.39 %
Brady Statistic AS VS Percent: 0.01 %
Brady Statistic RA Percent Paced: 34.58 %
Brady Statistic RV Percent Paced: 99.99 %
Date Time Interrogation Session: 20220824161738
Implantable Lead Implant Date: 20220811
Implantable Lead Implant Date: 20220811
Implantable Lead Location: 753859
Implantable Lead Location: 753860
Implantable Lead Model: 3830
Implantable Lead Model: 5076
Implantable Pulse Generator Implant Date: 20220811
Lead Channel Impedance Value: 418 Ohm
Lead Channel Impedance Value: 418 Ohm
Lead Channel Impedance Value: 570 Ohm
Lead Channel Impedance Value: 589 Ohm
Lead Channel Pacing Threshold Amplitude: 0.5 V
Lead Channel Pacing Threshold Amplitude: 0.5 V
Lead Channel Pacing Threshold Pulse Width: 0.4 ms
Lead Channel Pacing Threshold Pulse Width: 0.4 ms
Lead Channel Sensing Intrinsic Amplitude: 3.25 mV
Lead Channel Setting Pacing Amplitude: 3.5 V
Lead Channel Setting Pacing Amplitude: 3.5 V
Lead Channel Setting Pacing Pulse Width: 0.4 ms
Lead Channel Setting Sensing Sensitivity: 1.2 mV

## 2021-04-08 NOTE — Telephone Encounter (Signed)
Pt has wound check today.  Will assess and have Dr. Ladona Ridgel speak to patient if needed.

## 2021-04-08 NOTE — Progress Notes (Signed)
Wound check appointment. Steri-strips removed. Wound without redness or edema. Incision edges approximated, wound well healed. Normal device function. Thresholds, sensing, and impedances consistent with implant measurements. Device programmed at 3.5V for extra safety margin until 3 month visit. Histogram distribution appropriate for patient and level of activity. No mode switches or high ventricular rates noted. Patient educated about wound care, arm mobility, lifting restrictions. Patient is enrolled in remote monitoring, next scheduled check 06/25/21.  ROV with Dr. Ladona Ridgel on 07/06/21.

## 2021-04-08 NOTE — Patient Instructions (Addendum)

## 2021-06-04 ENCOUNTER — Telehealth: Payer: Self-pay | Admitting: Internal Medicine

## 2021-06-04 NOTE — Telephone Encounter (Signed)
Spoke with patient regarding her questions she asked about wearing magnet name tag informed her that it would be best to wear the name tag on the opposite side of the device. Patient also asked about some tingling , prickly sensations informed her this was normal as the nerve endings are getting used to something new informed patient that these feelings would eventually go away. Patient voiced understanding.

## 2021-06-04 NOTE — Telephone Encounter (Signed)
Pt is wanting to speak with Nurse Boneta Lucks in regards to questions about her pacemaker... edu pt that these questions would go to our device clinic... pt would rather speak with nurse Boneta Lucks please advise.Marland KitchenMarland Kitchen

## 2021-06-25 ENCOUNTER — Ambulatory Visit (INDEPENDENT_AMBULATORY_CARE_PROVIDER_SITE_OTHER): Payer: 59

## 2021-06-25 DIAGNOSIS — I442 Atrioventricular block, complete: Secondary | ICD-10-CM | POA: Diagnosis not present

## 2021-06-25 LAB — CUP PACEART REMOTE DEVICE CHECK
Battery Remaining Longevity: 148 mo
Battery Voltage: 3.19 V
Brady Statistic AP VP Percent: 15.55 %
Brady Statistic AP VS Percent: 0 %
Brady Statistic AS VP Percent: 84.44 %
Brady Statistic AS VS Percent: 0 %
Brady Statistic RA Percent Paced: 15.54 %
Brady Statistic RV Percent Paced: 100 %
Date Time Interrogation Session: 20221110005743
Implantable Lead Implant Date: 20220811
Implantable Lead Implant Date: 20220811
Implantable Lead Location: 753859
Implantable Lead Location: 753860
Implantable Lead Model: 3830
Implantable Lead Model: 5076
Implantable Pulse Generator Implant Date: 20220811
Lead Channel Impedance Value: 342 Ohm
Lead Channel Impedance Value: 418 Ohm
Lead Channel Impedance Value: 475 Ohm
Lead Channel Impedance Value: 570 Ohm
Lead Channel Pacing Threshold Amplitude: 0.75 V
Lead Channel Pacing Threshold Amplitude: 0.75 V
Lead Channel Pacing Threshold Pulse Width: 0.4 ms
Lead Channel Pacing Threshold Pulse Width: 0.4 ms
Lead Channel Sensing Intrinsic Amplitude: 2.25 mV
Lead Channel Sensing Intrinsic Amplitude: 2.25 mV
Lead Channel Setting Pacing Amplitude: 1.5 V
Lead Channel Setting Pacing Amplitude: 2 V
Lead Channel Setting Pacing Pulse Width: 0.4 ms
Lead Channel Setting Sensing Sensitivity: 1.2 mV

## 2021-07-06 ENCOUNTER — Ambulatory Visit (INDEPENDENT_AMBULATORY_CARE_PROVIDER_SITE_OTHER): Payer: 59 | Admitting: Internal Medicine

## 2021-07-06 ENCOUNTER — Other Ambulatory Visit: Payer: Self-pay

## 2021-07-06 ENCOUNTER — Encounter: Payer: Self-pay | Admitting: Internal Medicine

## 2021-07-06 VITALS — BP 124/62 | HR 67 | Ht 66.0 in | Wt 159.2 lb

## 2021-07-06 DIAGNOSIS — Z950 Presence of cardiac pacemaker: Secondary | ICD-10-CM | POA: Diagnosis not present

## 2021-07-06 DIAGNOSIS — R001 Bradycardia, unspecified: Secondary | ICD-10-CM

## 2021-07-06 NOTE — Patient Instructions (Signed)
Medication Instructions:  Your physician recommends that you continue on your current medications as directed. Please refer to the Current Medication list given to you today.  Labwork: None ordered.  Testing/Procedures: None ordered.  Follow-Up: Your physician wants you to follow-up in: one year with Gregg Taylor, MD or one of the following Advanced Practice Providers on your designated Care Team:   Renee Ursuy, PA-C Michael "Andy" Tillery, PA-C  Remote monitoring is used to monitor your Pacemaker from home. This monitoring reduces the number of office visits required to check your device to one time per year. It allows us to keep an eye on the functioning of your device to ensure it is working properly. You are scheduled for a device check from home on 09/24/2021. You may send your transmission at any time that day. If you have a wireless device, the transmission will be sent automatically. After your physician reviews your transmission, you will receive a postcard with your next transmission date.  Any Other Special Instructions Will Be Listed Below (If Applicable).  If you need a refill on your cardiac medications before your next appointment, please call your pharmacy.     

## 2021-07-06 NOTE — Progress Notes (Signed)
Remote pacemaker transmission.   

## 2021-07-06 NOTE — Progress Notes (Signed)
HPI Janet Mclaughlin returns today for followup. She is a pleasant 60 yo woman with a h/o progressive conduction system disease for which no secondary cause has been determined who underwent PPM insertion 3 months ago. She has done well except for occaisional palpitations and fleeting episodes of positional chest pain. She sleeps on her left side and notes that the device has moved a bit when she lays on the left.  No Known Allergies   Current Outpatient Medications  Medication Sig Dispense Refill   Multiple Vitamin (MULTIVITAMIN) tablet Take 2 tablets by mouth daily. Gummie (Patient not taking: Reported on 07/06/2021)     No current facility-administered medications for this visit.     Past Medical History:  Diagnosis Date   Body mass index (BMI) 25.0-25.9, adult    Bradycardia    Heart murmur    Overweight     ROS:   All systems reviewed and negative except as noted in the HPI.   Past Surgical History:  Procedure Laterality Date   NO PAST SURGERIES     PACEMAKER IMPLANT N/A 03/26/2021   Procedure: PACEMAKER IMPLANT;  Surgeon: Marinus Maw, MD;  Location: [facility];  Service: Cardiovascular;  Laterality: N/A;     Family History  Problem Relation Age of Onset   Breast cancer Maternal Grandmother    Hypertension Mother    Diabetes Father    Polycythemia Brother    Heart Problems Son        VENTRICULAR SEPTAL DEFECT   Cancer Maternal Grandfather        BILE DUCT     Social History   Socioeconomic History   Marital status: Married    Spouse name: Not on file   Number of children: Not on file   Years of education: Not on file   Highest education level: Not on file  Occupational History   Not on file  Tobacco Use   Smoking status: Never   Smokeless tobacco: Never  Substance and Sexual Activity   Alcohol use: Yes    Comment: 3-4 drinks/ week   Drug use: No   Sexual activity: Yes    Partners: Male  Other Topics Concern   Not on file   Social History Narrative   Not on file   Social Determinants of Health   Financial Resource Strain: Not on file  Food Insecurity: Not on file  Transportation Needs: Not on file  Physical Activity: Not on file  Stress: Not on file  Social Connections: Not on file  Intimate Partner Violence: Not on file     BP 124/62   Pulse 67   Ht 5\' 6"  (1.676 m)   Wt 159 lb 3.2 oz (72.2 kg)   LMP 08/15/2015   SpO2 96%   BMI 25.70 kg/m   Physical Exam:  Well appearing NAD HEENT: Unremarkable Neck:  7 cm JVD, no thyromegally Lymphatics:  No adenopathy Back:  No CVA tenderness Lungs:  Clear with no wheezes HEART:  Regular rate rhythm, no murmurs, no rubs, no clicks Abd:  soft, positive bowel sounds, no organomegally, no rebound, no guarding Ext:  2 plus pulses, no edema, no cyanosis, no clubbing Skin:  No rashes no nodules Neuro:  CN II through XII intact, motor grossly intact  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  See PaceArt for details.   Assess/Plan:  CHB - she is asymptomatic s/p PPM insertion. No escape  at 30 today. 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 She has felt well since her implant.  Anxiety - this is still a problem but I think that she is improved. Her activity is very good and I have encouraged her to stay active.   Janet Gowda Analynn Daum,MD

## 2021-09-23 ENCOUNTER — Telehealth: Payer: Self-pay | Admitting: Internal Medicine

## 2021-09-23 NOTE — Telephone Encounter (Signed)
Molnupiravir is just fine for her to take for COVID.

## 2021-09-23 NOTE — Telephone Encounter (Signed)
Sent mychart message

## 2021-09-23 NOTE — Telephone Encounter (Signed)
Pt made aware that molnupiravir is fine for her to take for covid from a cardiac perspective/meds, per PharmD Megan Supple.  Pt verbalized understanding and agrees with this plan.

## 2021-09-23 NOTE — Telephone Encounter (Signed)
Pt c/o of Chest Pain: STAT if CP now or developed within 24 hours  1. Are you having CP right now? No   2. Are you experiencing any other symptoms (ex. SOB, nausea, vomiting, sweating)? Has covid, but no other heart related symptoms  3. How long have you been experiencing CP? Started today   4. Is your CP continuous or coming and going? Coming and going, occurred 3x seconds at a time   5. Have you taken Nitroglycerin? No    Patient is calling stating she currently has covid. She has been prescribed Molnupiravir and wants to confirm it is safe to take. Please advise. Transferred to triage. ?

## 2021-09-23 NOTE — Telephone Encounter (Signed)
I spoke with patient. She reports she tested positive for covid this morning. She took Nyquil last evening.  I advised patient to avoid medications containing decongestants. Patient had telemedicine visit earlier today and molnupiravir was prescribed.  Patient is asking if OK from a heart standpoint to take this. Patient reports she has had episodes of brief chest pain today.  Lasts a few seconds and goes away on it's own.  Also states on 1/27 she had palpitations and squeezing in her chest.  Lasted about 5 minutes.

## 2021-09-24 ENCOUNTER — Ambulatory Visit (INDEPENDENT_AMBULATORY_CARE_PROVIDER_SITE_OTHER): Payer: 59

## 2021-09-24 DIAGNOSIS — I442 Atrioventricular block, complete: Secondary | ICD-10-CM | POA: Diagnosis not present

## 2021-09-24 LAB — CUP PACEART REMOTE DEVICE CHECK
Battery Remaining Longevity: 144 mo
Battery Voltage: 3.16 V
Brady Statistic AP VP Percent: 20.49 %
Brady Statistic AP VS Percent: 0 %
Brady Statistic AS VP Percent: 79.51 %
Brady Statistic AS VS Percent: 0 %
Brady Statistic RA Percent Paced: 20.47 %
Brady Statistic RV Percent Paced: 100 %
Date Time Interrogation Session: 20230208211852
Implantable Lead Implant Date: 20220811
Implantable Lead Implant Date: 20220811
Implantable Lead Location: 753859
Implantable Lead Location: 753860
Implantable Lead Model: 3830
Implantable Lead Model: 5076
Implantable Pulse Generator Implant Date: 20220811
Lead Channel Impedance Value: 361 Ohm
Lead Channel Impedance Value: 437 Ohm
Lead Channel Impedance Value: 456 Ohm
Lead Channel Impedance Value: 589 Ohm
Lead Channel Pacing Threshold Amplitude: 0.625 V
Lead Channel Pacing Threshold Amplitude: 0.75 V
Lead Channel Pacing Threshold Pulse Width: 0.4 ms
Lead Channel Pacing Threshold Pulse Width: 0.4 ms
Lead Channel Sensing Intrinsic Amplitude: 1.875 mV
Lead Channel Sensing Intrinsic Amplitude: 1.875 mV
Lead Channel Setting Pacing Amplitude: 1.5 V
Lead Channel Setting Pacing Amplitude: 2 V
Lead Channel Setting Pacing Pulse Width: 0.4 ms
Lead Channel Setting Sensing Sensitivity: 1.2 mV

## 2021-09-25 ENCOUNTER — Other Ambulatory Visit (HOSPITAL_COMMUNITY): Payer: Self-pay | Admitting: Orthopedic Surgery

## 2021-09-25 DIAGNOSIS — M25561 Pain in right knee: Secondary | ICD-10-CM

## 2021-09-29 NOTE — Progress Notes (Signed)
Remote pacemaker transmission.   

## 2021-11-04 ENCOUNTER — Telehealth: Payer: Self-pay | Admitting: Internal Medicine

## 2021-11-04 NOTE — Telephone Encounter (Signed)
Patient is calling requesting to speak with a nurse about rescheduling her MRI for sooner.  ?

## 2021-11-05 NOTE — Telephone Encounter (Signed)
Returned call to Pt. ? ?She is wanting the MRI of her knee done sooner and thought that the delay was related to our device clinic. ? ?Advised that we did not have any part of the MRI and she should call ordering office to see if that appointment could be moved up. ? ?Then she stated she has felt some palpitations and wonders if she has afib.  Advised that if she sent a remote transmission we could look for that. ? ?Then she states she notices a "spike" line on her watch when she looks.  Advised that is probably a pacing spike.  She states it is different than what she is used to seeing. ? ?Pt with multiple questions related to her device.  Advised would have a device clinic nurse call her this afternoon to answer her questions. ? ?

## 2021-11-05 NOTE — Telephone Encounter (Signed)
Successful telephone encounter to patient to follow up on her concern for palpitations and Afib as she states she was told by Dr. Lovena Le in November that she had just a little AF however it has not been followed up on and her apple watch "is showing spikes in the morning". Advised patient that although apple watches are good tools to use for exercise monitoring and HR, they are not always accurate as many variables can influence the ECG reading. Assisted patient with sending manual transmission to assess for any AF. No episodes noted. Presenting rhythm AS/VP.  ? ? ? ? ? ?

## 2021-11-11 ENCOUNTER — Telehealth: Payer: Self-pay

## 2021-11-11 NOTE — Telephone Encounter (Signed)
Pt calling to speak back with nurse Anna.. please advise ?

## 2021-11-12 ENCOUNTER — Ambulatory Visit (HOSPITAL_COMMUNITY)
Admission: RE | Admit: 2021-11-12 | Discharge: 2021-11-12 | Disposition: A | Discharge status: Home / Self Care | Payer: 59 | Source: Ambulatory Visit | Attending: Orthopedic Surgery | Admitting: Orthopedic Surgery

## 2021-11-12 DIAGNOSIS — M25561 Pain in right knee: Secondary | ICD-10-CM | POA: Diagnosis not present

## 2021-11-12 IMAGING — MR MR KNEE*R* W/O CM
7 series · 40 of 40 positions shown · non-contrast
Comparison: None.

CLINICAL DATA: Right anterolateral knee pain related to a fall
injury, [DATE]. Patient reports her patella is offset since
injury. Clinical concern for meniscus tear.

EXAM:
MRI OF THE RIGHT KNEE WITHOUT CONTRAST
TECHNIQUE: Multiplanar, multisequence MR imaging of the knee was performed. No
intravenous contrast was administered.

[Series 12: T2 fat-sat · axial · right · 4.0mm · 0.36mm/px · z∈[-139,-19]mm · 7 of 25 slices shown (1 of 3)]
[im 1/25]
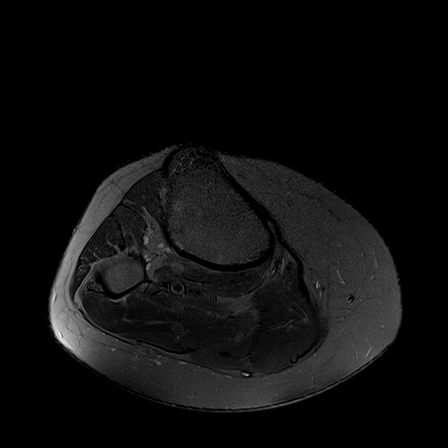
[im 5/25]
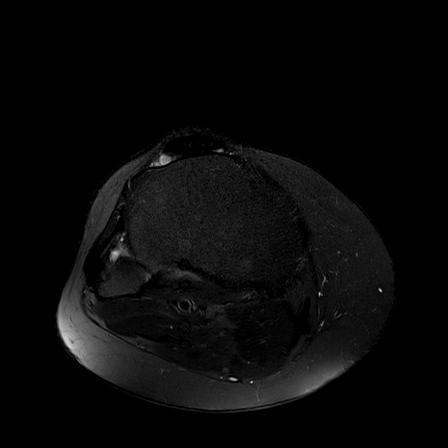
[im 9/25]
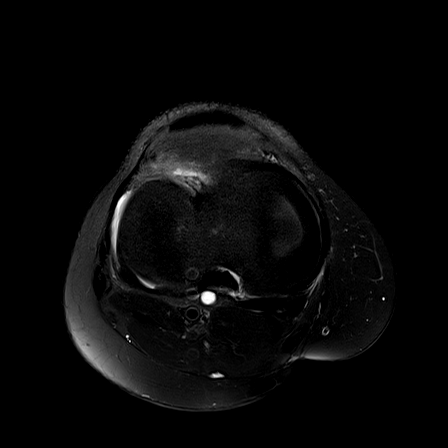
[im 13/25]
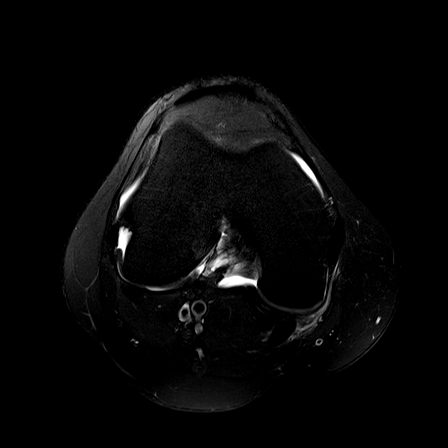
[im 17/25]
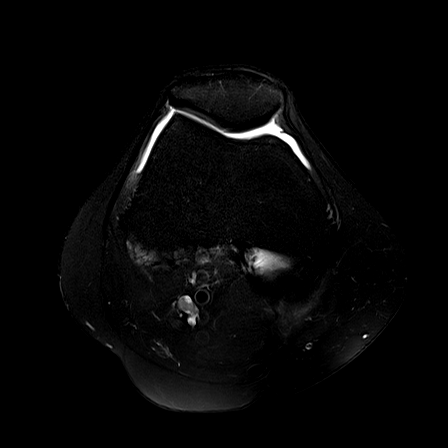
[im 21/25]
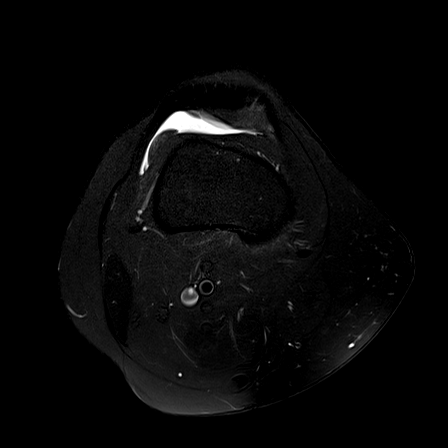
[im 25/25]
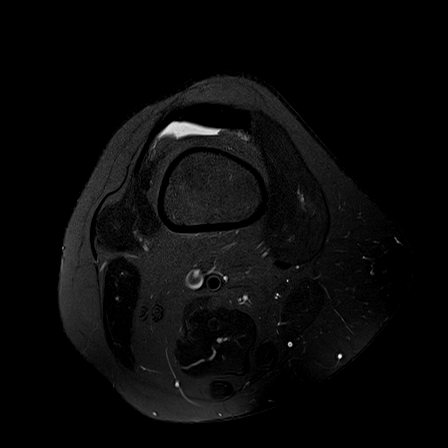

[Series 13: T1 · coronal · right · 4.0mm · 0.49mm/px · 6 of 20 slices shown]
[im 1/20]
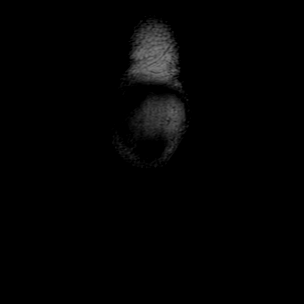
[im 4/20]
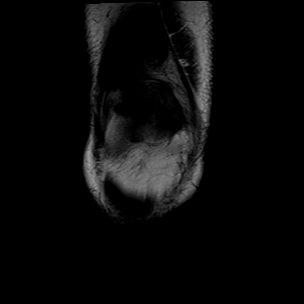
[im 8/20]
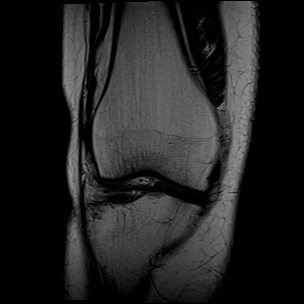
[im 12/20]
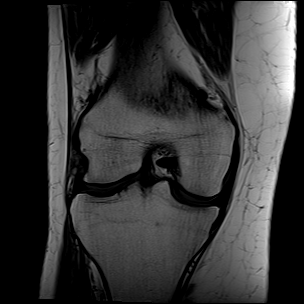
[im 16/20]
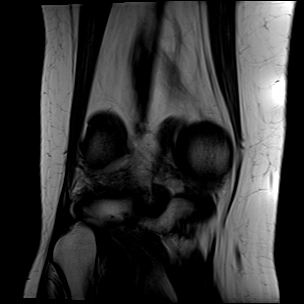
[im 20/20]
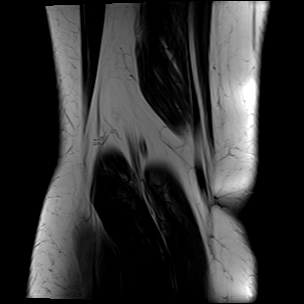

[Series 14: T2 fat-sat · coronal · right · 4.0mm · 0.59mm/px · 6 of 20 slices shown (2 of 3)]
[im 1/20]
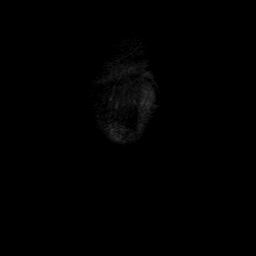
[im 4/20]
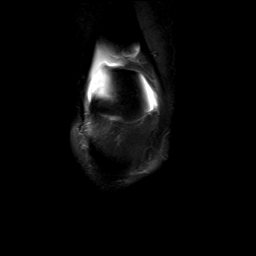
[im 8/20]
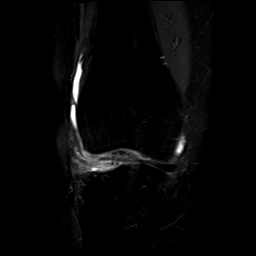
[im 12/20]
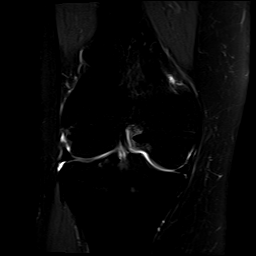
[im 16/20]
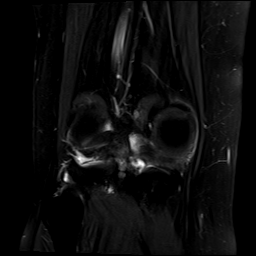
[im 20/20]
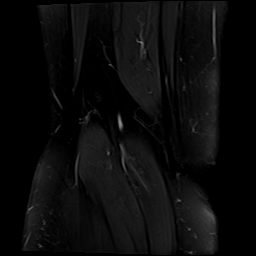

[Series 15: PD fat-sat · coronal · right · 4.0mm · 0.44mm/px · 6 of 20 slices shown (1 of 2)]
[im 1/20]
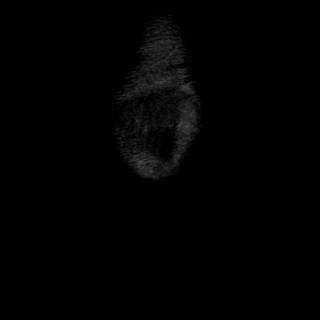
[im 4/20]
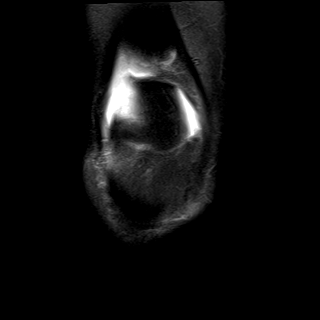
[im 8/20]
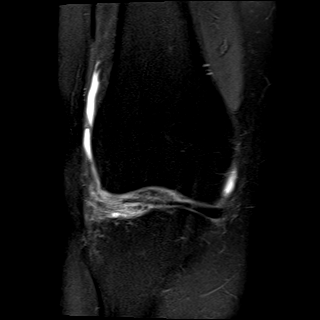
[im 12/20]
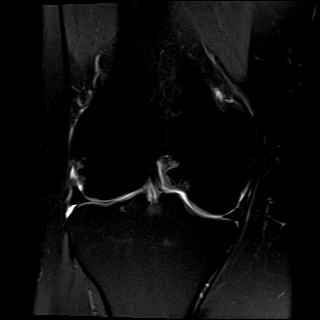
[im 16/20]
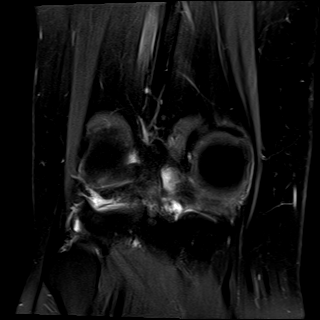
[im 20/20]
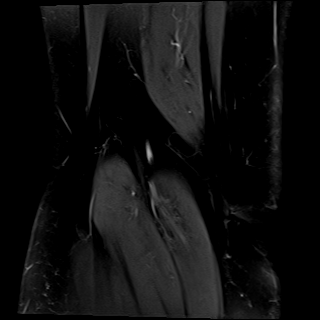

[Series 16: PD fat-sat · sagittal · right · 3.0mm · 0.52mm/px · 6 of 22 slices shown (2 of 2)]
[im 1/22]
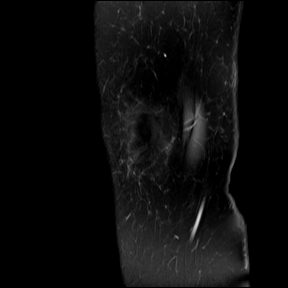
[im 5/22]
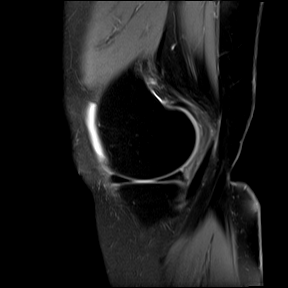
[im 9/22]
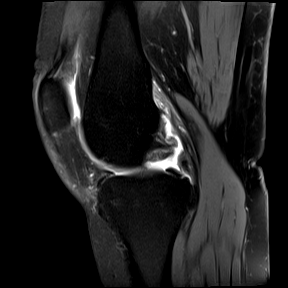
[im 13/22]
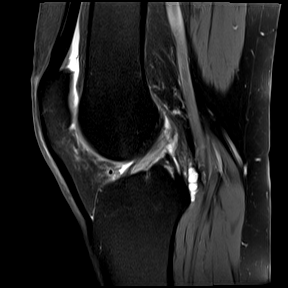
[im 17/22]
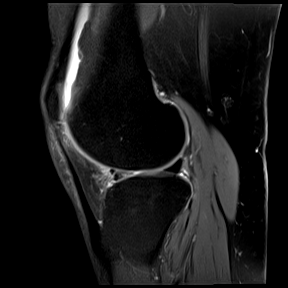
[im 22/22]
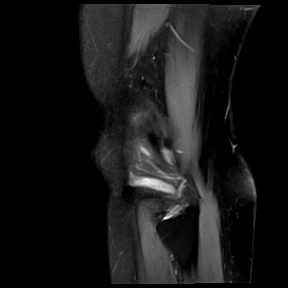

[Series 17: T2 fat-sat · sagittal · right · 3.0mm · 0.52mm/px · 6 of 22 slices shown (3 of 3)]
[im 1/22]
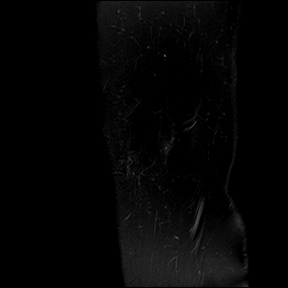
[im 5/22]
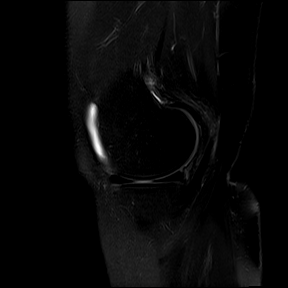
[im 9/22]
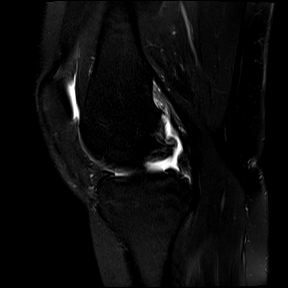
[im 13/22]
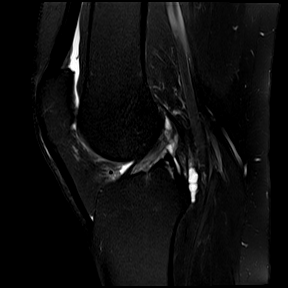
[im 17/22]
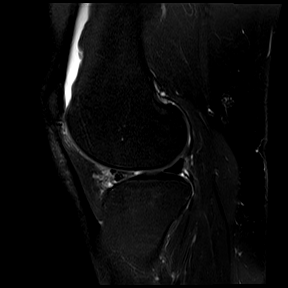
[im 22/22]
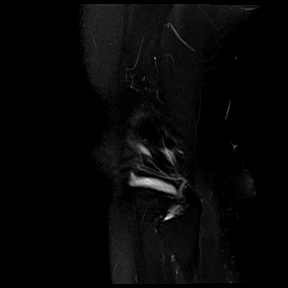

[Series 18: PD · oblique · right · 2.0mm · 0.47mm/px · 3 of 10 slices shown]
[im 1/10]
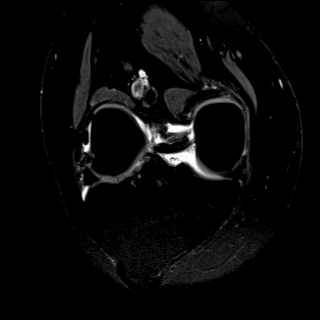
[im 5/10]
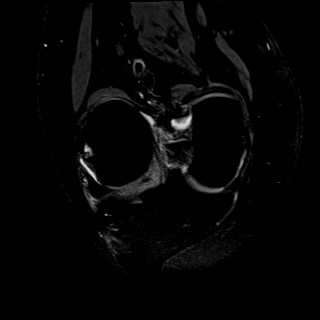
[im 10/10]
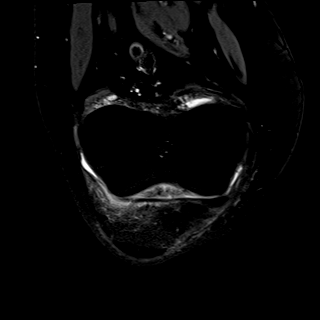

[40 of 40 positions shown; findings below may reference images not displayed]

FINDINGS: MENISCI

Medial: Intact.

Lateral: There is nondisplaced degenerative tearing of the anterior
horn of the lateral meniscus.

LIGAMENTS

Cruciates: ACL and PCL are intact.

Collaterals: Medial collateral ligament is intact. Lateral
collateral ligament complex is intact.

CARTILAGE

Patellofemoral:  Mild chondrosis.

Medial:  No chondral defect.

Lateral:  Mild to moderate chondrosis.

JOINT: Small joint effusion.

POPLITEAL FOSSA: Miniscule Baker cyst.

EXTENSOR MECHANISM: Intact quadriceps tendon. Intact patellar
tendon.

BONES: No aggressive osseous lesion. No fracture or dislocation.

Other: No fluid collection or hematoma. Muscles are normal.
IMPRESSION: Nondisplaced degenerative tearing of the anterior horn of the
lateral meniscus.

Lateral and patellofemoral predominant osteoarthritis.

Small joint effusion.

## 2021-11-12 NOTE — Progress Notes (Signed)
[facility] today for [facility] right knee wo contrast. Patient has medtronic device. CLE sent. Patient is very apprehensive about MRI scan in addition to recent pacemaker insertion in august. Patient states "im worried about doing the scan because of this device and everything ive went through with my heart block" Leanna Sato- Rep at the bedside to provide more information about pacemaker and MRI study. This RN will remain with patient to watch telemetry. Patient is more understanding and agreeable to complete scan at this time. Orders for DOO 85.  ?

## 2021-11-12 NOTE — Progress Notes (Signed)
Informed of MRI for today.  ? ?Device system confirmed to be MRI conditional, with implant date > 6 weeks ago, and no evidence of abandoned or epicardial leads in review of most recent CXR ?Interrogation from today reviewed, pt is currently AS-VP at ~65 bpm ?Change device settings for MRI to DOO at 85 bpm ? ?Tachy-therapies to off if applicable. ? ?Program device back to pre-MRI settings after completion of exam. ? ?Graciella Freer, PA-C  ?11/12/2021 1:29 PM   ?

## 2021-11-18 NOTE — Telephone Encounter (Signed)
Successful telephone encounter to patient to follow up on request to speak to RN. Patient states she was calling 7 days ago to confirm Medtronic would be available during her MRI. Unfortunately this RN has been out of the office and MRI has been completed. No other concerns.  ?

## 2021-12-24 ENCOUNTER — Ambulatory Visit (INDEPENDENT_AMBULATORY_CARE_PROVIDER_SITE_OTHER): Payer: PRIVATE HEALTH INSURANCE

## 2021-12-24 DIAGNOSIS — I442 Atrioventricular block, complete: Secondary | ICD-10-CM

## 2021-12-24 LAB — CUP PACEART REMOTE DEVICE CHECK
Battery Remaining Longevity: 142 mo
Battery Voltage: 3.1 V
Brady Statistic AP VP Percent: 20.03 %
Brady Statistic AP VS Percent: 0 %
Brady Statistic AS VP Percent: 79.91 %
Brady Statistic AS VS Percent: 0.06 %
Brady Statistic RA Percent Paced: 20.04 %
Brady Statistic RV Percent Paced: 99.94 %
Date Time Interrogation Session: 20230510200215
Implantable Lead Implant Date: 20220811
Implantable Lead Implant Date: 20220811
Implantable Lead Location: 753859
Implantable Lead Location: 753860
Implantable Lead Model: 3830
Implantable Lead Model: 5076
Implantable Pulse Generator Implant Date: 20220811
Lead Channel Impedance Value: 342 Ohm
Lead Channel Impedance Value: 437 Ohm
Lead Channel Impedance Value: 456 Ohm
Lead Channel Impedance Value: 627 Ohm
Lead Channel Pacing Threshold Amplitude: 0.5 V
Lead Channel Pacing Threshold Amplitude: 0.875 V
Lead Channel Pacing Threshold Pulse Width: 0.4 ms
Lead Channel Pacing Threshold Pulse Width: 0.4 ms
Lead Channel Sensing Intrinsic Amplitude: 2.5 mV
Lead Channel Sensing Intrinsic Amplitude: 2.5 mV
Lead Channel Setting Pacing Amplitude: 1.5 V
Lead Channel Setting Pacing Amplitude: 2 V
Lead Channel Setting Pacing Pulse Width: 0.4 ms
Lead Channel Setting Sensing Sensitivity: 1.2 mV

## 2021-12-31 NOTE — Progress Notes (Signed)
Remote pacemaker transmission.   

## 2022-03-24 LAB — CUP PACEART REMOTE DEVICE CHECK
Battery Remaining Longevity: 137 mo
Battery Voltage: 3.06 V
Brady Statistic AP VP Percent: 20.24 %
Brady Statistic AP VS Percent: 0 %
Brady Statistic AS VP Percent: 79.67 %
Brady Statistic AS VS Percent: 0.09 %
Brady Statistic RA Percent Paced: 20.25 %
Brady Statistic RV Percent Paced: 99.91 %
Date Time Interrogation Session: 20230809004323
Implantable Lead Implant Date: 20220811
Implantable Lead Implant Date: 20220811
Implantable Lead Location: 753859
Implantable Lead Location: 753860
Implantable Lead Model: 3830
Implantable Lead Model: 5076
Implantable Pulse Generator Implant Date: 20220811
Lead Channel Impedance Value: 323 Ohm
Lead Channel Impedance Value: 380 Ohm
Lead Channel Impedance Value: 437 Ohm
Lead Channel Impedance Value: 456 Ohm
Lead Channel Pacing Threshold Amplitude: 0.5 V
Lead Channel Pacing Threshold Amplitude: 0.875 V
Lead Channel Pacing Threshold Pulse Width: 0.4 ms
Lead Channel Pacing Threshold Pulse Width: 0.4 ms
Lead Channel Sensing Intrinsic Amplitude: 3 mV
Lead Channel Sensing Intrinsic Amplitude: 3 mV
Lead Channel Setting Pacing Amplitude: 1.5 V
Lead Channel Setting Pacing Amplitude: 2 V
Lead Channel Setting Pacing Pulse Width: 0.4 ms
Lead Channel Setting Sensing Sensitivity: 1.2 mV

## 2022-03-25 ENCOUNTER — Ambulatory Visit (INDEPENDENT_AMBULATORY_CARE_PROVIDER_SITE_OTHER): Payer: 59

## 2022-03-25 DIAGNOSIS — I442 Atrioventricular block, complete: Secondary | ICD-10-CM | POA: Diagnosis not present

## 2022-04-20 NOTE — Progress Notes (Signed)
Remote pacemaker transmission.   

## 2022-06-07 ENCOUNTER — Other Ambulatory Visit: Payer: Self-pay | Admitting: Obstetrics and Gynecology

## 2022-06-07 DIAGNOSIS — Z09 Encounter for follow-up examination after completed treatment for conditions other than malignant neoplasm: Secondary | ICD-10-CM

## 2022-06-24 ENCOUNTER — Ambulatory Visit (INDEPENDENT_AMBULATORY_CARE_PROVIDER_SITE_OTHER): Payer: 59

## 2022-06-24 DIAGNOSIS — I442 Atrioventricular block, complete: Secondary | ICD-10-CM

## 2022-06-24 LAB — CUP PACEART REMOTE DEVICE CHECK
Battery Remaining Longevity: 125 mo
Battery Voltage: 3.03 V
Brady Statistic AP VP Percent: 13.29 %
Brady Statistic AP VS Percent: 0 %
Brady Statistic AS VP Percent: 86.67 %
Brady Statistic AS VS Percent: 0.03 %
Brady Statistic RA Percent Paced: 13.28 %
Brady Statistic RV Percent Paced: 99.97 %
Date Time Interrogation Session: 20231109073212
Implantable Lead Connection Status: 753985
Implantable Lead Connection Status: 753985
Implantable Lead Implant Date: 20220811
Implantable Lead Implant Date: 20220811
Implantable Lead Location: 753859
Implantable Lead Location: 753860
Implantable Lead Model: 3830
Implantable Lead Model: 5076
Implantable Pulse Generator Implant Date: 20220811
Lead Channel Impedance Value: 342 Ohm
Lead Channel Impedance Value: 361 Ohm
Lead Channel Impedance Value: 456 Ohm
Lead Channel Impedance Value: 532 Ohm
Lead Channel Pacing Threshold Amplitude: 0.75 V
Lead Channel Pacing Threshold Amplitude: 1.125 V
Lead Channel Pacing Threshold Pulse Width: 0.4 ms
Lead Channel Pacing Threshold Pulse Width: 0.4 ms
Lead Channel Sensing Intrinsic Amplitude: 1.75 mV
Lead Channel Sensing Intrinsic Amplitude: 1.75 mV
Lead Channel Sensing Intrinsic Amplitude: 11.5 mV
Lead Channel Sensing Intrinsic Amplitude: 11.5 mV
Lead Channel Setting Pacing Amplitude: 1.5 V
Lead Channel Setting Pacing Amplitude: 2.25 V
Lead Channel Setting Pacing Pulse Width: 0.4 ms
Lead Channel Setting Sensing Sensitivity: 1.2 mV
Zone Setting Status: 755011
Zone Setting Status: 755011

## 2022-07-02 NOTE — Progress Notes (Signed)
Remote pacemaker transmission.   

## 2022-07-21 ENCOUNTER — Ambulatory Visit
Admission: RE | Admit: 2022-07-21 | Discharge: 2022-07-21 | Disposition: A | Discharge status: Home / Self Care | Payer: 59 | Source: Ambulatory Visit | Attending: Obstetrics and Gynecology | Admitting: Obstetrics and Gynecology

## 2022-07-21 DIAGNOSIS — Z09 Encounter for follow-up examination after completed treatment for conditions other than malignant neoplasm: Secondary | ICD-10-CM

## 2022-09-23 ENCOUNTER — Ambulatory Visit (INDEPENDENT_AMBULATORY_CARE_PROVIDER_SITE_OTHER): Payer: 59

## 2022-09-23 DIAGNOSIS — I442 Atrioventricular block, complete: Secondary | ICD-10-CM | POA: Diagnosis not present

## 2022-09-23 LAB — CUP PACEART REMOTE DEVICE CHECK
Battery Remaining Longevity: 123 mo
Battery Voltage: 3.02 V
Brady Statistic AP VP Percent: 21.16 %
Brady Statistic AP VS Percent: 0 %
Brady Statistic AS VP Percent: 78.83 %
Brady Statistic AS VS Percent: 0.02 %
Brady Statistic RA Percent Paced: 21.15 %
Brady Statistic RV Percent Paced: 99.98 %
Date Time Interrogation Session: 20240207213544
Implantable Lead Connection Status: 753985
Implantable Lead Connection Status: 753985
Implantable Lead Implant Date: 20220811
Implantable Lead Implant Date: 20220811
Implantable Lead Location: 753859
Implantable Lead Location: 753860
Implantable Lead Model: 3830
Implantable Lead Model: 5076
Implantable Pulse Generator Implant Date: 20220811
Lead Channel Impedance Value: 342 Ohm
Lead Channel Impedance Value: 361 Ohm
Lead Channel Impedance Value: 475 Ohm
Lead Channel Impedance Value: 513 Ohm
Lead Channel Pacing Threshold Amplitude: 0.75 V
Lead Channel Pacing Threshold Amplitude: 1 V
Lead Channel Pacing Threshold Pulse Width: 0.4 ms
Lead Channel Pacing Threshold Pulse Width: 0.4 ms
Lead Channel Sensing Intrinsic Amplitude: 1.125 mV
Lead Channel Sensing Intrinsic Amplitude: 1.125 mV
Lead Channel Sensing Intrinsic Amplitude: 11.5 mV
Lead Channel Sensing Intrinsic Amplitude: 11.5 mV
Lead Channel Setting Pacing Amplitude: 1.5 V
Lead Channel Setting Pacing Amplitude: 2.25 V
Lead Channel Setting Pacing Pulse Width: 0.4 ms
Lead Channel Setting Sensing Sensitivity: 1.2 mV
Zone Setting Status: 755011
Zone Setting Status: 755011

## 2022-10-14 ENCOUNTER — Encounter: Payer: Self-pay | Admitting: Internal Medicine

## 2022-10-14 ENCOUNTER — Ambulatory Visit: Payer: 59 | Attending: Internal Medicine | Admitting: Internal Medicine

## 2022-10-14 VITALS — BP 126/78 | HR 63 | Ht 65.0 in | Wt 157.6 lb

## 2022-10-14 DIAGNOSIS — I442 Atrioventricular block, complete: Secondary | ICD-10-CM | POA: Diagnosis not present

## 2022-10-14 DIAGNOSIS — Z950 Presence of cardiac pacemaker: Secondary | ICD-10-CM

## 2022-10-14 NOTE — Progress Notes (Signed)
Remote pacemaker transmission.   

## 2022-10-14 NOTE — Patient Instructions (Signed)
Medication Instructions:  Your physician recommends that you continue on your current medications as directed. Please refer to the Current Medication list given to you today.  *If you need a refill on your cardiac medications before your next appointment, please call your pharmacy*  Lab Work: None ordered.  If you have labs (blood work) drawn today and your tests are completely normal, you will receive your results only by: [place] (if you have MyChart) OR A paper copy in the mail If you have any lab test that is abnormal or we need to change your treatment, we will call you to review the results.  Testing/Procedures: None ordered.  Follow-Up: At [facility], you and your health needs are our priority.  As part of our continuing mission to provide you with exceptional heart care, we have created designated Provider Care Teams.  These Care Teams include your primary Cardiologist (physician) and Advanced Practice Providers (APPs -  Physician Assistants and Nurse Practitioners) who all work together to provide you with the care you need, when you need it.  We recommend signing up for the patient portal called "MyChart".  Sign up information is provided on this After Visit Summary.  MyChart is used to connect with patients for Virtual Visits (Telemedicine).  Patients are able to view lab/test results, encounter notes, upcoming appointments, etc.  Non-urgent messages can be sent to your provider as well.   To learn more about what you can do with MyChart, go to [email or web address].    Your next appointment:   1 year(s)  The format for your next appointment:   In Person  Provider:   Cristopher Peru, MD{or one of the following Advanced Practice Providers on your designated Care Team:   Tommye Standard, [place] Legrand Como "Jonni Sanger" Chalmers Cater, [place]  Remote monitoring is used to monitor your Pacemaker from home. This monitoring reduces the number of office visits required to check your device to  one time per year. It allows [place] to keep an eye on the functioning of your device to ensure it is working properly. You are scheduled for a device check from home on 12/23/22. You may send your transmission at any time that day. If you have a wireless device, the transmission will be sent automatically. After your physician reviews your transmission, you will receive a postcard with your next transmission date.

## 2022-10-14 NOTE — Progress Notes (Signed)
HPI [name] returns today for followup. She is a pleasant 62 yo woman with a h/o progressive conduction system disease for which no secondary cause has been determined who underwent PPM insertion 1.5 years ago. She has done well except for occaisional palpitations and fleeting episodes of positional chest pain.  Allergies  Allergen Reactions   Dog Epithelium    Dust Mite Extract    Tree Extract      Current Outpatient Medications  Medication Sig Dispense Refill   Multiple Vitamin (MULTIVITAMIN) tablet Take 2 tablets by mouth daily. Gummie (Patient not taking: Reported on 07/06/2021)     No current facility-administered medications for this visit.     Past Medical History:  Diagnosis Date   Body mass index (BMI) 25.0-25.9, adult    Bradycardia    Heart murmur    Overweight     ROS:   All systems reviewed and negative except as noted in the HPI.   Past Surgical History:  Procedure Laterality Date   NO PAST SURGERIES     PACEMAKER IMPLANT N/A 03/26/2021   Procedure: PACEMAKER IMPLANT;  Surgeon: Evans Lance, MD;  Location: [place] [facility];  Service: Cardiovascular;  Laterality: N/A;     Family History  Problem Relation Age of Onset   Breast cancer Maternal Grandmother    Hypertension Mother    Diabetes Father    Polycythemia Brother    Heart Problems Son        VENTRICULAR SEPTAL DEFECT   Cancer Maternal Grandfather        BILE DUCT     Social History   Socioeconomic History   Marital status: Married    Spouse name: Not on file   Number of children: Not on file   Years of education: Not on file   Highest education level: Not on file  Occupational History   Not on file  Tobacco Use   Smoking status: Never   Smokeless tobacco: Never  Substance and Sexual Activity   Alcohol use: Yes    Comment: 3-4 drinks/ week   Drug use: No   Sexual activity: Yes    Partners: Male  Other Topics Concern   Not on file  Social History Narrative    Not on file   Social Determinants of Health   Financial Resource Strain: Not on file  Food Insecurity: Not on file  Transportation Needs: Not on file  Physical Activity: Not on file  Stress: Not on file  Social Connections: Not on file  Intimate Partner Violence: Not on file     BP 126/78   Pulse 63   Ht '5\' 5"'$  (1.651 m)   Wt 157 lb 9.6 oz (71.5 kg)   LMP 08/15/2015   SpO2 97%   BMI 26.23 kg/m   Physical Exam:  Well appearing NAD HEENT: Unremarkable Neck:  No JVD, no thyromegally Lymphatics:  No adenopathy Back:  No CVA tenderness Lungs:  Clear with no wheezes HEART:  Regular rate rhythm, no murmurs, no rubs, no clicks Abd:  soft, positive bowel sounds, no organomegally, no rebound, no guarding Ext:  2 plus pulses, no edema, no cyanosis, no clubbing Skin:  No rashes no nodules Neuro:  CN II through XII intact, motor grossly intact  EKG - nsr with ventricular pacing  DEVICE  Normal device function.  See PaceArt for details.   Assess/Plan:  CHB - she is asymptomatic s/p PPM insertion. No escape at 30 today. PPM -  her medtronic DDD PM is working normally. She has felt well since her implant.  Anxiety - this is still a problem but I think that she is improved. Her activity is very good and I have encouraged her to stay active.    Carleene Overlie Nole Robey,MD

## 2022-10-26 NOTE — Addendum Note (Signed)
Addended by: Laure Kidney A on: 10/26/2022 10:07 AM   Modules accepted: Orders

## 2022-12-22 LAB — CUP PACEART REMOTE DEVICE CHECK
Battery Remaining Longevity: 129 mo
Battery Voltage: 3.02 V
Brady Statistic AP VP Percent: 23.38 %
Brady Statistic AP VS Percent: 0 %
Brady Statistic AS VP Percent: 76.62 %
Brady Statistic AS VS Percent: 0.01 %
Brady Statistic RA Percent Paced: 23.36 %
Brady Statistic RV Percent Paced: 99.99 %
Date Time Interrogation Session: 20240507213114
Implantable Lead Connection Status: 753985
Implantable Lead Connection Status: 753985
Implantable Lead Implant Date: 20220811
Implantable Lead Implant Date: 20220811
Implantable Lead Location: 753859
Implantable Lead Location: 753860
Implantable Lead Model: 3830
Implantable Lead Model: 5076
Implantable Pulse Generator Implant Date: 20220811
Lead Channel Impedance Value: 342 Ohm
Lead Channel Impedance Value: 380 Ohm
Lead Channel Impedance Value: 456 Ohm
Lead Channel Impedance Value: 494 Ohm
Lead Channel Pacing Threshold Amplitude: 0.625 V
Lead Channel Pacing Threshold Amplitude: 1 V
Lead Channel Pacing Threshold Pulse Width: 0.4 ms
Lead Channel Pacing Threshold Pulse Width: 0.4 ms
Lead Channel Sensing Intrinsic Amplitude: 1.375 mV
Lead Channel Sensing Intrinsic Amplitude: 1.375 mV
Lead Channel Sensing Intrinsic Amplitude: 11.5 mV
Lead Channel Sensing Intrinsic Amplitude: 11.5 mV
Lead Channel Setting Pacing Amplitude: 1.5 V
Lead Channel Setting Pacing Amplitude: 2 V
Lead Channel Setting Pacing Pulse Width: 0.4 ms
Lead Channel Setting Sensing Sensitivity: 1.2 mV
Zone Setting Status: 755011
Zone Setting Status: 755011

## 2022-12-23 ENCOUNTER — Ambulatory Visit (INDEPENDENT_AMBULATORY_CARE_PROVIDER_SITE_OTHER): Payer: 59

## 2022-12-23 DIAGNOSIS — I442 Atrioventricular block, complete: Secondary | ICD-10-CM | POA: Diagnosis not present

## 2023-01-12 NOTE — Progress Notes (Signed)
Remote pacemaker transmission.   

## 2023-03-10 ENCOUNTER — Telehealth: Payer: Self-pay | Admitting: Cardiology

## 2023-03-10 NOTE — Telephone Encounter (Signed)
Call returned to patient.  See notes in transaction inquiry re 09/23/2022.

## 2023-03-10 NOTE — Telephone Encounter (Signed)
Patient called and stated no insurance was filed for her visit on 09/23/22.  Patient wants insurance filed with her insurance and the "past due" amount removed from her bill and the bill resubmitted to her.  Patient wants a call back to confirm and stated this process was very difficult to get processed.  Patient stated her accoun number is [phone].

## 2023-03-24 ENCOUNTER — Ambulatory Visit (INDEPENDENT_AMBULATORY_CARE_PROVIDER_SITE_OTHER): Payer: 59

## 2023-03-24 DIAGNOSIS — I442 Atrioventricular block, complete: Secondary | ICD-10-CM | POA: Diagnosis not present

## 2023-05-10 NOTE — Progress Notes (Signed)
Remote pacemaker transmission.   

## 2023-06-23 ENCOUNTER — Ambulatory Visit (INDEPENDENT_AMBULATORY_CARE_PROVIDER_SITE_OTHER): Payer: 59

## 2023-06-23 DIAGNOSIS — I442 Atrioventricular block, complete: Secondary | ICD-10-CM | POA: Diagnosis not present

## 2023-06-28 LAB — CUP PACEART REMOTE DEVICE CHECK
Battery Remaining Longevity: 123 mo
Battery Voltage: 3.01 V
Brady Statistic AP VP Percent: 21.85 %
Brady Statistic AP VS Percent: 0 %
Brady Statistic AS VP Percent: 78.14 %
Brady Statistic AS VS Percent: 0.01 %
Brady Statistic RA Percent Paced: 21.84 %
Brady Statistic RV Percent Paced: 99.99 %
Date Time Interrogation Session: 20241103225310
Implantable Lead Connection Status: 753985
Implantable Lead Connection Status: 753985
Implantable Lead Implant Date: 20220811
Implantable Lead Implant Date: 20220811
Implantable Lead Location: 753859
Implantable Lead Location: 753860
Implantable Lead Model: 3830
Implantable Lead Model: 5076
Implantable Pulse Generator Implant Date: 20220811
Lead Channel Impedance Value: 342 Ohm
Lead Channel Impedance Value: 380 Ohm
Lead Channel Impedance Value: 456 Ohm
Lead Channel Impedance Value: 513 Ohm
Lead Channel Pacing Threshold Amplitude: 0.5 V
Lead Channel Pacing Threshold Amplitude: 1 V
Lead Channel Pacing Threshold Pulse Width: 0.4 ms
Lead Channel Pacing Threshold Pulse Width: 0.4 ms
Lead Channel Sensing Intrinsic Amplitude: 11.5 mV
Lead Channel Sensing Intrinsic Amplitude: 11.5 mV
Lead Channel Sensing Intrinsic Amplitude: 2.5 mV
Lead Channel Sensing Intrinsic Amplitude: 2.5 mV
Lead Channel Setting Pacing Amplitude: 1.5 V
Lead Channel Setting Pacing Amplitude: 2 V
Lead Channel Setting Pacing Pulse Width: 0.4 ms
Lead Channel Setting Sensing Sensitivity: 1.2 mV
Zone Setting Status: 755011
Zone Setting Status: 755011

## 2023-07-06 NOTE — Progress Notes (Signed)
Remote pacemaker transmission.   

## 2023-08-16 ENCOUNTER — Other Ambulatory Visit (HOSPITAL_COMMUNITY): Payer: Self-pay | Admitting: Orthopaedic Surgery

## 2023-08-16 DIAGNOSIS — M25572 Pain in left ankle and joints of left foot: Secondary | ICD-10-CM

## 2023-08-23 ENCOUNTER — Other Ambulatory Visit: Payer: Self-pay | Admitting: Obstetrics

## 2023-08-23 ENCOUNTER — Other Ambulatory Visit: Payer: Self-pay | Admitting: Obstetrics and Gynecology

## 2023-08-23 DIAGNOSIS — Z1231 Encounter for screening mammogram for malignant neoplasm of breast: Secondary | ICD-10-CM

## 2023-09-08 ENCOUNTER — Ambulatory Visit: Payer: 59

## 2023-09-20 ENCOUNTER — Ambulatory Visit
Admission: RE | Admit: 2023-09-20 | Discharge: 2023-09-20 | Disposition: A | Discharge status: Home / Self Care | Payer: Managed Care, Other (non HMO) | Source: Ambulatory Visit | Attending: Obstetrics | Admitting: Obstetrics

## 2023-09-20 DIAGNOSIS — Z1231 Encounter for screening mammogram for malignant neoplasm of breast: Secondary | ICD-10-CM

## 2023-09-21 LAB — CUP PACEART REMOTE DEVICE CHECK
Battery Remaining Longevity: 121 mo
Battery Voltage: 3.01 V
Brady Statistic AP VP Percent: 26.14 %
Brady Statistic AP VS Percent: 0 %
Brady Statistic AS VP Percent: 73.85 %
Brady Statistic AS VS Percent: 0.01 %
Brady Statistic RA Percent Paced: 26.12 %
Brady Statistic RV Percent Paced: 99.99 %
Date Time Interrogation Session: 20250201185631
Implantable Lead Connection Status: 753985
Implantable Lead Connection Status: 753985
Implantable Lead Implant Date: 20220811
Implantable Lead Implant Date: 20220811
Implantable Lead Location: 753859
Implantable Lead Location: 753860
Implantable Lead Model: 3830
Implantable Lead Model: 5076
Implantable Pulse Generator Implant Date: 20220811
Lead Channel Impedance Value: 342 Ohm
Lead Channel Impedance Value: 399 Ohm
Lead Channel Impedance Value: 475 Ohm
Lead Channel Impedance Value: 532 Ohm
Lead Channel Pacing Threshold Amplitude: 0.625 V
Lead Channel Pacing Threshold Amplitude: 1 V
Lead Channel Pacing Threshold Pulse Width: 0.4 ms
Lead Channel Pacing Threshold Pulse Width: 0.4 ms
Lead Channel Sensing Intrinsic Amplitude: 11.5 mV
Lead Channel Sensing Intrinsic Amplitude: 11.5 mV
Lead Channel Sensing Intrinsic Amplitude: 2.375 mV
Lead Channel Sensing Intrinsic Amplitude: 2.375 mV
Lead Channel Setting Pacing Amplitude: 1.5 V
Lead Channel Setting Pacing Amplitude: 2 V
Lead Channel Setting Pacing Pulse Width: 0.4 ms
Lead Channel Setting Sensing Sensitivity: 1.2 mV
Zone Setting Status: 755011
Zone Setting Status: 755011

## 2023-09-22 ENCOUNTER — Ambulatory Visit (INDEPENDENT_AMBULATORY_CARE_PROVIDER_SITE_OTHER): Payer: PRIVATE HEALTH INSURANCE

## 2023-09-22 DIAGNOSIS — I442 Atrioventricular block, complete: Secondary | ICD-10-CM | POA: Diagnosis not present

## 2023-09-26 ENCOUNTER — Other Ambulatory Visit: Payer: Self-pay | Admitting: Obstetrics

## 2023-09-26 DIAGNOSIS — R928 Other abnormal and inconclusive findings on diagnostic imaging of breast: Secondary | ICD-10-CM

## 2023-10-08 ENCOUNTER — Ambulatory Visit
Admission: RE | Admit: 2023-10-08 | Discharge: 2023-10-08 | Disposition: A | Discharge status: Home / Self Care | Payer: Managed Care, Other (non HMO) | Source: Ambulatory Visit | Attending: Obstetrics | Admitting: Obstetrics

## 2023-10-08 DIAGNOSIS — R928 Other abnormal and inconclusive findings on diagnostic imaging of breast: Secondary | ICD-10-CM

## 2023-10-27 NOTE — Progress Notes (Signed)
 Remote pacemaker transmission.

## 2023-11-14 ENCOUNTER — Encounter: Payer: Self-pay | Admitting: Internal Medicine

## 2023-11-14 ENCOUNTER — Ambulatory Visit: Payer: PRIVATE HEALTH INSURANCE | Attending: Internal Medicine | Admitting: Internal Medicine

## 2023-11-14 VITALS — BP 114/76 | HR 60 | Ht 66.0 in | Wt 160.0 lb

## 2023-11-14 DIAGNOSIS — Z950 Presence of cardiac pacemaker: Secondary | ICD-10-CM

## 2023-11-14 DIAGNOSIS — I442 Atrioventricular block, complete: Secondary | ICD-10-CM | POA: Diagnosis not present

## 2023-11-14 LAB — CUP PACEART INCLINIC DEVICE CHECK
Battery Remaining Longevity: 119 mo
Battery Voltage: 3.01 V
Brady Statistic AP VP Percent: 23.49 %
Brady Statistic AP VS Percent: 0 %
Brady Statistic AS VP Percent: 76.49 %
Brady Statistic AS VS Percent: 0.01 %
Brady Statistic RA Percent Paced: 23.48 %
Brady Statistic RV Percent Paced: 99.99 %
Date Time Interrogation Session: 20250331152712
Implantable Lead Connection Status: 753985
Implantable Lead Connection Status: 753985
Implantable Lead Implant Date: 20220811
Implantable Lead Implant Date: 20220811
Implantable Lead Location: 753859
Implantable Lead Location: 753860
Implantable Lead Model: 3830
Implantable Lead Model: 5076
Implantable Pulse Generator Implant Date: 20220811
Lead Channel Impedance Value: 342 Ohm
Lead Channel Impedance Value: 361 Ohm
Lead Channel Impedance Value: 475 Ohm
Lead Channel Impedance Value: 494 Ohm
Lead Channel Pacing Threshold Amplitude: 0.625 V
Lead Channel Pacing Threshold Amplitude: 1 V
Lead Channel Pacing Threshold Pulse Width: 0.4 ms
Lead Channel Pacing Threshold Pulse Width: 0.4 ms
Lead Channel Sensing Intrinsic Amplitude: 1.75 mV
Lead Channel Sensing Intrinsic Amplitude: 11.5 mV
Lead Channel Sensing Intrinsic Amplitude: 11.5 mV
Lead Channel Sensing Intrinsic Amplitude: 3.375 mV
Lead Channel Setting Pacing Amplitude: 1.5 V
Lead Channel Setting Pacing Amplitude: 2 V
Lead Channel Setting Pacing Pulse Width: 0.4 ms
Lead Channel Setting Sensing Sensitivity: 1.2 mV
Zone Setting Status: 755011
Zone Setting Status: 755011

## 2023-11-14 NOTE — Patient Instructions (Signed)
 Medication Instructions:  Your physician recommends that you continue on your current medications as directed. Please refer to the Current Medication list given to you today.  *If you need a refill on your cardiac medications before your next appointment, please call your pharmacy*  Lab Work: None ordered.  You may go to any [facility] Location for your lab work:  [facility] - [address] ([facility]) - [address] [phone] [address]  [place] - [address]   [place]  - [address]   [place] - [address] - [address] [place] [place]  - [address] ([facility]   If you have labs (blood work) drawn today and your tests are completely normal, you will receive your results only by: [employer] (if you have MyChart)  If you have any lab test that is abnormal or we need to change your treatment, we will call you or send a MyChart message to review the results.  Testing/Procedures: None ordered.  Follow-Up: At [facility], you and your health needs are our priority.  As part of our continuing mission to provide you with exceptional heart care, we have created designated Provider Care Teams.  These Care Teams include your primary Cardiologist (physician) and Advanced Practice Providers (APPs -  Physician Assistants and Nurse Practitioners) who all work together to provide you with the care you need, when you need it.   Your next appointment:   1 year(s)  The format for your next appointment:   In Person  Provider:   Dr Virl Son one of the following Advanced Practice Providers on your designated Care Team:   Francis Dowse, PA-C Casimiro Needle "Mardelle Matte" [place], [place] Earnest Rosier, NP  Note: Remote monitoring is used to monitor your Pacemaker/ ICD from home. This monitoring reduces the number of office visits required to check your  device to one time per year. It allows [place] to keep an eye on the functioning of your device to ensure it is working properly.            Valet parking services will be available as well.

## 2023-11-14 NOTE — Progress Notes (Signed)
 HPI [name] returns today for followup. 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 Cardiac MRI at that time, was negative for infiltration. She has done well except for occaisional palpitations and fleeting episodes of positional chest pain.  Allergies  Allergen Reactions   Dog Epithelium    Dust Mite Extract    Tree Extract      Current Outpatient Medications  Medication Sig Dispense Refill   EPINEPHrine 0.3 mg/0.3 mL IJ SOAJ injection Inject 0.3 mg into the muscle as needed for anaphylaxis.     Multiple Vitamin (MULTIVITAMIN) tablet Take 2 tablets by mouth daily. Gummie     No current facility-administered medications for this visit.     Past Medical History:  Diagnosis Date   Body mass index (BMI) 25.0-25.9, adult    Bradycardia    Heart murmur    Overweight     ROS:   All systems reviewed and negative except as noted in the HPI.   Past Surgical History:  Procedure Laterality Date   NO PAST SURGERIES     PACEMAKER IMPLANT N/A 03/26/2021   Procedure: PACEMAKER IMPLANT;  Surgeon: Marinus Maw, MD;  Location: [facility] INVASIVE CV LAB;  Service: Cardiovascular;  Laterality: N/A;     Family History  Problem Relation Age of Onset   Breast cancer Maternal Grandmother    Hypertension Mother    Diabetes Father    Polycythemia Brother    Heart Problems Son        VENTRICULAR SEPTAL DEFECT   Cancer Maternal Grandfather        BILE DUCT     Social History   Socioeconomic History   Marital status: Married    Spouse name: Not on file   Number of children: Not on file   Years of education: Not on file   Highest education level: Not on file  Occupational History   Not on file  Tobacco Use   Smoking status: Never   Smokeless tobacco: Never  Substance and Sexual Activity   Alcohol use: Yes    Comment: 3-4 drinks/ week   Drug  use: No   Sexual activity: Yes    Partners: Male  Other Topics Concern   Not on file  Social History Narrative   Not on file   Social Drivers of Health   Financial Resource Strain: Not on file  Food Insecurity: Not on file  Transportation Needs: Not on file  Physical Activity: Not on file  Stress: Not on file  Social Connections: Not on file  Intimate Partner Violence: Not on file     BP 114/76   Pulse 60   Ht 5\' 6"  (1.676 m)   Wt 160 lb (72.6 kg)   LMP 08/15/2015   SpO2 96%   BMI 25.82 kg/m   Physical Exam:  Well appearing NAD HEENT: Unremarkable Neck:  No JVD, no thyromegally Lymphatics:  No adenopathy Back:  No CVA tenderness Lungs:  Clear with no wheezes HEART:  Regular rate rhythm, no murmurs, no rubs, no clicks Abd:  soft, positive bowel sounds, no organomegally, no rebound, no guarding Ext:  2 plus pulses, no edema, no cyanosis, no clubbing Skin:  No rashes no nodules Neuro:  CN II through XII intact, motor grossly intact  EKG - nsr with ventricular pacing  DEVICE  Normal device function.  See PaceArt for details.   Assess/Plan:  CHB - she is asymptomatic s/p PPM insertion. No escape at 30 today. 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 She has felt well since her implant. Her paced QRS is 84 ms.  Anxiety - this is still a problem but I think that she is improved. Her activity is very good and I have encouraged her to stay active.    Sharlot Gowda Ledonna Dormer,MD

## 2023-12-22 ENCOUNTER — Ambulatory Visit (INDEPENDENT_AMBULATORY_CARE_PROVIDER_SITE_OTHER): Payer: PRIVATE HEALTH INSURANCE

## 2023-12-22 DIAGNOSIS — I442 Atrioventricular block, complete: Secondary | ICD-10-CM | POA: Diagnosis not present

## 2023-12-22 LAB — CUP PACEART REMOTE DEVICE CHECK
Battery Remaining Longevity: 117 mo
Battery Voltage: 3.01 V
Brady Statistic AP VP Percent: 25.76 %
Brady Statistic AP VS Percent: 0 %
Brady Statistic AS VP Percent: 74.23 %
Brady Statistic AS VS Percent: 0.01 %
Brady Statistic RA Percent Paced: 25.74 %
Brady Statistic RV Percent Paced: 99.99 %
Date Time Interrogation Session: 20250507225724
Implantable Lead Connection Status: 753985
Implantable Lead Connection Status: 753985
Implantable Lead Implant Date: 20220811
Implantable Lead Implant Date: 20220811
Implantable Lead Location: 753859
Implantable Lead Location: 753860
Implantable Lead Model: 3830
Implantable Lead Model: 5076
Implantable Pulse Generator Implant Date: 20220811
Lead Channel Impedance Value: 342 Ohm
Lead Channel Impedance Value: 380 Ohm
Lead Channel Impedance Value: 456 Ohm
Lead Channel Impedance Value: 475 Ohm
Lead Channel Pacing Threshold Amplitude: 0.625 V
Lead Channel Pacing Threshold Amplitude: 1 V
Lead Channel Pacing Threshold Pulse Width: 0.4 ms
Lead Channel Pacing Threshold Pulse Width: 0.4 ms
Lead Channel Sensing Intrinsic Amplitude: 1 mV
Lead Channel Sensing Intrinsic Amplitude: 1 mV
Lead Channel Sensing Intrinsic Amplitude: 11.5 mV
Lead Channel Sensing Intrinsic Amplitude: 11.5 mV
Lead Channel Setting Pacing Amplitude: 1.5 V
Lead Channel Setting Pacing Amplitude: 2 V
Lead Channel Setting Pacing Pulse Width: 0.4 ms
Lead Channel Setting Sensing Sensitivity: 1.2 mV
Zone Setting Status: 755011
Zone Setting Status: 755011

## 2023-12-26 ENCOUNTER — Encounter: Payer: Self-pay | Admitting: Internal Medicine

## 2024-01-30 NOTE — Progress Notes (Signed)
 Remote pacemaker transmission.

## 2024-01-31 ENCOUNTER — Telehealth: Payer: Self-pay | Admitting: Internal Medicine

## 2024-01-31 NOTE — Telephone Encounter (Signed)
 Spoke with patient and she would like a callback from [name]

## 2024-01-31 NOTE — Telephone Encounter (Signed)
 Patient called stating she wanted to speak to nurse about her last visit, wouldn't go into any further details.

## 2024-02-01 NOTE — Telephone Encounter (Signed)
 Left message to call back

## 2024-02-07 NOTE — Telephone Encounter (Signed)
 Spoke with Pt. Pts questions were regarding another pt. Closing this encounter.

## 2024-03-22 ENCOUNTER — Ambulatory Visit (INDEPENDENT_AMBULATORY_CARE_PROVIDER_SITE_OTHER): Payer: PRIVATE HEALTH INSURANCE

## 2024-03-22 DIAGNOSIS — I442 Atrioventricular block, complete: Secondary | ICD-10-CM

## 2024-03-23 LAB — CUP PACEART REMOTE DEVICE CHECK
Battery Remaining Longevity: 113 mo
Battery Voltage: 3.01 V
Brady Statistic AP VP Percent: 23.37 %
Brady Statistic AP VS Percent: 0 %
Brady Statistic AS VP Percent: 76.62 %
Brady Statistic AS VS Percent: 0.01 %
Brady Statistic RA Percent Paced: 23.35 %
Brady Statistic RV Percent Paced: 99.99 %
Date Time Interrogation Session: 20250807090725
Implantable Lead Connection Status: 753985
Implantable Lead Connection Status: 753985
Implantable Lead Implant Date: 20220811
Implantable Lead Implant Date: 20220811
Implantable Lead Location: 753859
Implantable Lead Location: 753860
Implantable Lead Model: 3830
Implantable Lead Model: 5076
Implantable Pulse Generator Implant Date: 20220811
Lead Channel Impedance Value: 342 Ohm
Lead Channel Impedance Value: 380 Ohm
Lead Channel Impedance Value: 437 Ohm
Lead Channel Impedance Value: 494 Ohm
Lead Channel Pacing Threshold Amplitude: 0.625 V
Lead Channel Pacing Threshold Amplitude: 1 V
Lead Channel Pacing Threshold Pulse Width: 0.4 ms
Lead Channel Pacing Threshold Pulse Width: 0.4 ms
Lead Channel Sensing Intrinsic Amplitude: 11.5 mV
Lead Channel Sensing Intrinsic Amplitude: 11.5 mV
Lead Channel Sensing Intrinsic Amplitude: 4.25 mV
Lead Channel Sensing Intrinsic Amplitude: 4.25 mV
Lead Channel Setting Pacing Amplitude: 1.5 V
Lead Channel Setting Pacing Amplitude: 2 V
Lead Channel Setting Pacing Pulse Width: 0.4 ms
Lead Channel Setting Sensing Sensitivity: 1.2 mV
Zone Setting Status: 755011
Zone Setting Status: 755011

## 2024-03-24 ENCOUNTER — Ambulatory Visit: Payer: Self-pay | Admitting: Internal Medicine

## 2024-05-12 NOTE — Progress Notes (Signed)
 Remote PPM Transmission

## 2024-05-24 ENCOUNTER — Emergency Department (HOSPITAL_COMMUNITY)
Admission: EM | Admit: 2024-05-24 | Discharge: 2024-05-25 | Disposition: A | Discharge status: Home / Self Care | Attending: Emergency Medicine | Admitting: Emergency Medicine

## 2024-05-24 ENCOUNTER — Other Ambulatory Visit: Payer: Self-pay

## 2024-05-24 DIAGNOSIS — L03116 Cellulitis of left lower limb: Secondary | ICD-10-CM | POA: Insufficient documentation

## 2024-05-24 DIAGNOSIS — L03311 Cellulitis of abdominal wall: Secondary | ICD-10-CM | POA: Insufficient documentation

## 2024-05-24 DIAGNOSIS — S30861A Insect bite (nonvenomous) of abdominal wall, initial encounter: Secondary | ICD-10-CM | POA: Diagnosis not present

## 2024-05-24 DIAGNOSIS — L0390 Cellulitis, unspecified: Secondary | ICD-10-CM

## 2024-05-24 DIAGNOSIS — W57XXXA Bitten or stung by nonvenomous insect and other nonvenomous arthropods, initial encounter: Secondary | ICD-10-CM | POA: Diagnosis not present

## 2024-05-24 DIAGNOSIS — S3991XA Unspecified injury of abdomen, initial encounter: Secondary | ICD-10-CM | POA: Diagnosis present

## 2024-05-24 DIAGNOSIS — S80862A Insect bite (nonvenomous), left lower leg, initial encounter: Secondary | ICD-10-CM | POA: Insufficient documentation

## 2024-05-24 NOTE — ED Triage Notes (Addendum)
 Patient reports unknown insect bite at left upper shin and left lateral abdomen with redness/swelling yesterday afternoon . No fever /airway intact , respirations unlabored . Took Benadryl prior to arrival .

## 2024-05-25 MED ORDER — CEPHALEXIN 500 MG PO CAPS
500.0000 mg | ORAL_CAPSULE | Freq: Four times a day (QID) | ORAL | 0 refills | Status: AC
Start: 2024-05-25 — End: ?

## 2024-05-25 NOTE — ED Provider Notes (Signed)
 [place] EMERGENCY DEPARTMENT AT [facility] Provider Note   CSN: [id number] Arrival date & time: 05/24/24  2143     Patient presents with: Insect Bite   Janet Mclaughlin is a 63 y.o. female patient who presents to the emergency department today for further evaluation of insect bites.  Patient states she was out in the woods in a house with lots of spiders.  She was also on the woods.  She does not recall getting bit by any ticks.  She had boots up to her knees and felt like something got stuck in her boot and bit her right on the left anterior shin and crawled up her leg and bit her again on the stomach and left wrist.  This was approximately Wednesday afternoon.  Since then has now become more red, swollen, and hot.  The worst is on the left anterior shin.  She denies any fever, chills, drainage, chest pain, shortness of breath.  HPI     Prior to Admission medications   Medication Sig Start Date End Date Taking? Authorizing Provider  cephALEXin (KEFLEX) 500 MG capsule Take 1 capsule (500 mg total) by mouth 4 (four) times daily. 05/25/24  Yes Theotis, Osborn Pullin M, PA-C  EPINEPHrine 0.3 mg/0.3 mL IJ SOAJ injection Inject 0.3 mg into the muscle as needed for anaphylaxis. 10/28/23   [provider]  Multiple Vitamin (MULTIVITAMIN) tablet Take 2 tablets by mouth daily. Gummie    [provider]    Allergies: Dog epithelium, Dust mite extract, and Tree extract    Review of Systems  All other systems reviewed and are negative.   Updated Vital Signs BP 132/80 (BP Location: Right Arm)   Pulse 67   Temp 98.8 F (37.1 C)   Resp 16   LMP 08/15/2015   SpO2 99%   Physical Exam Vitals and nursing note reviewed.  Constitutional:      Appearance: Normal appearance.  HENT:     Head: Normocephalic and atraumatic.  Eyes:     General:        Right eye: No discharge.        Left eye: No discharge.     Conjunctiva/sclera: Conjunctivae normal.  Pulmonary:      Effort: Pulmonary effort is normal.  Abdominal:   Skin:    General: Skin is warm and dry.     Findings: No rash.      Neurological:     General: No focal deficit present.     Mental Status: She is alert.  Psychiatric:        Mood and Affect: Mood normal.        Behavior: Behavior normal.     (all labs ordered are listed, but only abnormal results are displayed) Labs Reviewed - No data to display  EKG: None  Radiology: No results found.   Procedures   Medications Ordered in the ED - No data to display   Medical Decision Making Janet Mclaughlin is a 62 y.o. female patient who presents to the emergency department today for further evaluation of an insect bite. This patient presents with initial presentation of local erythema, warmth, swelling concerning for cellulitis. Sensitivity/pain to light touch around the erythematous area. No lymphangitic spread visible and no fluid pockets or fluctuance concerning for abscess noted. Low concern for osteomyelitis or DVT.  Do not think that this is [facility] spotted fever or Lyme disease given the absence of bull's-eye rash and no engorged tick. No  immune compromise, bullae, pain out of proportion, or rapid progression concerning for necrotizing fasciitis. Patient to be discharged home with keflex with follow up with their PMD.  Strict return precautions were discussed.  All questions or concerns addressed.   Risk Prescription drug management.     Final diagnoses:  Insect bite, unspecified site, initial encounter  Cellulitis, unspecified cellulitis site    ED Discharge Orders          Ordered    cephALEXin (KEFLEX) 500 MG capsule  4 times daily        05/25/24 0622               Theotis Peers [place], [place] 05/25/24 9373    Raford Lenis, MD 05/25/24 [phone]

## 2024-05-25 NOTE — Discharge Instructions (Signed)
 As we discussed, please take the antibiotics as prescribed.  You should wait at least 24 to 48 hours after taking the medications before making decision on whether or not to return.  You will be looking for increased amount of redness, drainage, fever, streaking redness up towards the torso, or any other concerns you might have.

## 2024-06-21 ENCOUNTER — Ambulatory Visit (INDEPENDENT_AMBULATORY_CARE_PROVIDER_SITE_OTHER): Payer: PRIVATE HEALTH INSURANCE

## 2024-06-21 DIAGNOSIS — I442 Atrioventricular block, complete: Secondary | ICD-10-CM

## 2024-06-21 LAB — CUP PACEART REMOTE DEVICE CHECK
Battery Remaining Longevity: 110 mo
Battery Voltage: 3 V
Brady Statistic AP VP Percent: 19.02 %
Brady Statistic AP VS Percent: 0 %
Brady Statistic AS VP Percent: 80.97 %
Brady Statistic AS VS Percent: 0.02 %
Brady Statistic RA Percent Paced: 18.99 %
Brady Statistic RV Percent Paced: 99.98 %
Date Time Interrogation Session: 20251105202253
Implantable Lead Connection Status: 753985
Implantable Lead Connection Status: 753985
Implantable Lead Implant Date: 20220811
Implantable Lead Implant Date: 20220811
Implantable Lead Location: 753859
Implantable Lead Location: 753860
Implantable Lead Model: 3830
Implantable Lead Model: 5076
Implantable Pulse Generator Implant Date: 20220811
Lead Channel Impedance Value: 342 Ohm
Lead Channel Impedance Value: 380 Ohm
Lead Channel Impedance Value: 437 Ohm
Lead Channel Impedance Value: 456 Ohm
Lead Channel Pacing Threshold Amplitude: 0.625 V
Lead Channel Pacing Threshold Amplitude: 1 V
Lead Channel Pacing Threshold Pulse Width: 0.4 ms
Lead Channel Pacing Threshold Pulse Width: 0.4 ms
Lead Channel Sensing Intrinsic Amplitude: 11.5 mV
Lead Channel Sensing Intrinsic Amplitude: 11.5 mV
Lead Channel Sensing Intrinsic Amplitude: 2 mV
Lead Channel Sensing Intrinsic Amplitude: 2 mV
Lead Channel Setting Pacing Amplitude: 1.5 V
Lead Channel Setting Pacing Amplitude: 2 V
Lead Channel Setting Pacing Pulse Width: 0.4 ms
Lead Channel Setting Sensing Sensitivity: 1.2 mV
Zone Setting Status: 755011
Zone Setting Status: 755011

## 2024-06-22 ENCOUNTER — Ambulatory Visit: Payer: Self-pay | Admitting: Internal Medicine

## 2024-06-25 NOTE — Progress Notes (Signed)
 Remote PPM Transmission

## 2024-08-01 ENCOUNTER — Ambulatory Visit (HOSPITAL_COMMUNITY)
Admission: RE | Admit: 2024-08-01 | Discharge: 2024-08-01 | Disposition: A | Discharge status: Home / Self Care | Source: Ambulatory Visit | Attending: Medical | Admitting: Medical

## 2024-08-01 ENCOUNTER — Encounter: Payer: Self-pay | Admitting: *Deleted

## 2024-08-01 ENCOUNTER — Other Ambulatory Visit (HOSPITAL_COMMUNITY): Payer: Self-pay | Admitting: Medical

## 2024-08-01 DIAGNOSIS — M25561 Pain in right knee: Secondary | ICD-10-CM | POA: Insufficient documentation

## 2024-08-01 NOTE — Progress Notes (Incomplete)
°  Device system confirmed to be MRI conditional, with implant date > 6 weeks ago, and no evidence of abandoned or epicardial leads in review of most recent CXR  Device last cleared by EP Provider: Prentice Passey, PA  08/01/2024  Clearance is good through for 1 year as long as parameters remain stable at time of check. If pt undergoes a cardiac device procedure during that time, they should be re-cleared.   Tachy-therapies to be programmed off if applicable with device back to pre-MRI settings after completion of exam.  Med[employer]onic - Programming recommendation received through Medtronic App/Tablet  Suzan Recardo Arna Debby  08/01/2024 11:16 AM

## 2024-08-01 NOTE — Progress Notes (Signed)
 Patient was monitored by this RN during MRI scan due to presence of a pacemaker. Cardiac rhythm was continuously monitored throughout the procedure. Prior to the start of the scan, the pacemaker was placed in MRI-safe mode by the MRI technician and/or pacemaker representative. Following the completion of the scan, the device was returned to its pre-MRI settings. Neurological status and orientation post-procedure were unchanged from baseline.   Pre-procedure Heart Rate (Prior to being placed in MRI safe mode): 78 Intra-MRI HR: 100 Post-procedure Heart Rate (Once pacemaker is returned to baseline mode):  70

## 2024-08-02 NOTE — CV Procedure (Signed)
°  Device system confirmed to be MRI conditional, with implant date > 6 weeks ago, and no evidence of abandoned or epicardial leads in review of most recent CXR  Device last cleared by EP Provider: Prentice Passey 07/31/24  Clearance is good through for 1 year as long as parameters remain stable at time of check. If pt undergoes a cardiac device procedure during that time, they should be re-cleared.   Tachy-therapies to be programmed off if applicable with device back to pre-MRI settings after completion of exam.  [employer] - Programming recommendation received through Medtronic App/Tablet  Emogene Bouchard, RT  08/02/2024 2:25 PM

## 2024-09-20 ENCOUNTER — Ambulatory Visit: Payer: PRIVATE HEALTH INSURANCE

## 2024-09-21 LAB — CUP PACEART REMOTE DEVICE CHECK
Battery Remaining Longevity: 109 mo
Battery Voltage: 3 V
Brady Statistic AP VP Percent: 21.52 %
Brady Statistic AP VS Percent: 0 %
Brady Statistic AS VP Percent: 78.48 %
Brady Statistic AS VS Percent: 0 %
Brady Statistic RA Percent Paced: 21.49 %
Brady Statistic RV Percent Paced: 100 %
Date Time Interrogation Session: 20260204224827
Implantable Lead Connection Status: 753985
Implantable Lead Connection Status: 753985
Implantable Lead Implant Date: 20220811
Implantable Lead Implant Date: 20220811
Implantable Lead Location: 753859
Implantable Lead Location: 753860
Implantable Lead Model: 3830
Implantable Lead Model: 5076
Implantable Pulse Generator Implant Date: 20220811
Lead Channel Impedance Value: 342 Ohm
Lead Channel Impedance Value: 399 Ohm
Lead Channel Impedance Value: 475 Ohm
Lead Channel Impedance Value: 475 Ohm
Lead Channel Pacing Threshold Amplitude: 0.5 V
Lead Channel Pacing Threshold Amplitude: 0.875 V
Lead Channel Pacing Threshold Pulse Width: 0.4 ms
Lead Channel Pacing Threshold Pulse Width: 0.4 ms
Lead Channel Sensing Intrinsic Amplitude: 1.5 mV
Lead Channel Sensing Intrinsic Amplitude: 1.5 mV
Lead Channel Sensing Intrinsic Amplitude: 11.5 mV
Lead Channel Sensing Intrinsic Amplitude: 11.5 mV
Lead Channel Setting Pacing Amplitude: 1.5 V
Lead Channel Setting Pacing Amplitude: 2 V
Lead Channel Setting Pacing Pulse Width: 0.4 ms
Lead Channel Setting Sensing Sensitivity: 1.2 mV
Zone Setting Status: 755011
Zone Setting Status: 755011
